# Patient Record
Sex: Male | Born: 1990 | Race: White | Hispanic: No | Marital: Single | Smoking: Current every day smoker
Health system: Southern US, Community
[De-identification: ages and names within clinical notes are randomized; demographics above are authoritative.]

## PROBLEM LIST (undated history)

## (undated) DIAGNOSIS — M549 Dorsalgia, unspecified: Secondary | ICD-10-CM

## (undated) DIAGNOSIS — F419 Anxiety disorder, unspecified: Secondary | ICD-10-CM

## (undated) DIAGNOSIS — F909 Attention-deficit hyperactivity disorder, unspecified type: Secondary | ICD-10-CM

## (undated) HISTORY — PX: OTHER SURGICAL HISTORY: SHX169

## (undated) HISTORY — DX: Anxiety disorder, unspecified: F41.9

## (undated) HISTORY — DX: Attention-deficit hyperactivity disorder, unspecified type: F90.9

## (undated) HISTORY — DX: Dorsalgia, unspecified: M54.9

---
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
## 2011-08-05 ENCOUNTER — Emergency Department: Payer: Self-pay | Admitting: Emergency Medicine

## 2011-12-13 ENCOUNTER — Emergency Department (HOSPITAL_COMMUNITY)
Admission: EM | Admit: 2011-12-13 | Discharge: 2011-12-13 | Disposition: A | Discharge status: Home / Self Care | Payer: Self-pay | Attending: Emergency Medicine | Admitting: Emergency Medicine

## 2011-12-13 ENCOUNTER — Encounter: Payer: Self-pay | Admitting: *Deleted

## 2011-12-13 DIAGNOSIS — M545 Low back pain, unspecified: Secondary | ICD-10-CM | POA: Insufficient documentation

## 2011-12-13 DIAGNOSIS — X500XXA Overexertion from strenuous movement or load, initial encounter: Secondary | ICD-10-CM | POA: Insufficient documentation

## 2011-12-13 DIAGNOSIS — S39012A Strain of muscle, fascia and tendon of lower back, initial encounter: Secondary | ICD-10-CM

## 2011-12-13 DIAGNOSIS — S335XXA Sprain of ligaments of lumbar spine, initial encounter: Secondary | ICD-10-CM | POA: Insufficient documentation

## 2011-12-13 MED ORDER — CYCLOBENZAPRINE HCL 10 MG PO TABS: 10.0000 mg | ORAL_TABLET | Freq: Three times a day (TID) | ORAL | Status: AC | PRN

## 2011-12-13 MED ORDER — HYDROCODONE-ACETAMINOPHEN 5-325 MG PO TABS: ORAL_TABLET | ORAL | Status: AC

## 2011-12-13 NOTE — ED Notes (Signed)
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

## 2011-12-13 NOTE — ED Provider Notes (Signed)
History     CSN: [id number]  Arrival date & time 12/13/11  1541   First MD Initiated Contact with Patient 12/13/11 1629      Chief Complaint  Patient presents with  . Back Pain    (Consider location/radiation/quality/duration/timing/severity/associated sxs/prior treatment) HPI Comments: Patient c/o lower back pain for 2 days.  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  He denies falling,  numbness, tingling or radiation of pain.  Also denies incontinence of urine or bowel function  Patient is a 21 y.o. male presenting with back pain. The history is provided by the patient.  Back Pain  This is a new problem. The current episode started 2 days ago. The problem occurs constantly. The problem has not changed since onset.The pain is associated with lifting heavy objects and twisting (pushing). The pain is present in the lumbar spine. The quality of the pain is described as aching. The pain does not radiate. The pain is moderate. The symptoms are aggravated by bending, twisting and certain positions. The pain is the same all the time. Pertinent negatives include no numbness, no abdominal pain, no abdominal swelling, no bowel incontinence, no perianal numbness, no bladder incontinence, no dysuria, no leg pain, no paresthesias, no paresis, no tingling and no weakness. He has tried nothing for the symptoms. The treatment provided no relief.    History reviewed. No pertinent past medical history.  History reviewed. No pertinent past surgical history.  History reviewed. No pertinent family history.  History  Substance Use Topics  . Smoking status: Former Smoker -- 1.0 packs/day  . Smokeless tobacco: Not on file  . Alcohol Use:       Review of Systems  Gastrointestinal: Negative for abdominal pain and bowel incontinence.  Genitourinary: Negative for bladder incontinence, dysuria, hematuria, decreased urine volume and difficulty urinating.  Musculoskeletal: Positive for back  pain. Negative for joint swelling and gait problem.  Skin: Negative.   Neurological: Negative for tingling, weakness, numbness and paresthesias.  All other systems reviewed and are negative.    Allergies  Review of patient's allergies indicates no known allergies.  Home Medications  No current outpatient prescriptions on file.  BP 124/80  Pulse 50  Temp(Src) 98.4 F (36.9 C) (Oral)  Resp 20  Ht 5\' 4"  (1.626 m)  Wt 120 lb (54.432 kg)  BMI 20.60 kg/m2  SpO2 100%  Physical Exam  Nursing note and vitals reviewed. Constitutional: He is oriented to person, place, and time. He appears well-developed and well-nourished. No distress.  HENT:  Head: Normocephalic and atraumatic.  Mouth/Throat: Oropharynx is clear and moist.  Cardiovascular: Normal rate, regular rhythm and normal heart sounds.   Pulmonary/Chest: Effort normal and breath sounds normal. No respiratory distress.  Abdominal: Soft. He exhibits no distension. There is no tenderness.  Musculoskeletal: Normal range of motion. He exhibits tenderness. He exhibits no edema.       Lumbar back: He exhibits tenderness and pain. He exhibits normal range of motion, no bony tenderness, no swelling, no edema, no laceration and normal pulse.       ttp of the lumbar spinal muscles.    Neurological: He is alert and oriented to person, place, and time. No cranial nerve deficit or sensory deficit. He exhibits normal muscle tone. Coordination normal.  Reflex Scores:      Patellar reflexes are 2+ on the right side and 2+ on the left side.      Achilles reflexes are 2+ on the  right side and 2+ on the left side. Skin: Skin is warm and dry.    ED Course  Procedures (including critical care time)       MDM    ttp of the lumbar paraspinal muscles. No spinal tenderness on exam, no abdominal tenderness.   No focal neuro deficits on exam.  Patient is ambulatory, with slight limp.  Likely muscular strain.  He agrees to close f/u with his PMD  if the pain is not improving  Patient / Family / Caregiver understand and agree with initial ED impression and plan with expectations set for ED visit.         Shaneka Efaw L. Godric Lavell, PA 12/15/11 1320

## 2011-12-13 NOTE — ED Notes (Signed)
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

## 2011-12-16 NOTE — ED Provider Notes (Signed)
Medical screening examination/treatment/procedure(s) were performed by non-physician practitioner and as supervising physician I was immediately available for consultation/collaboration.  Filomeno Cromley K Linker, MD 12/16/11 1316 

## 2012-08-30 ENCOUNTER — Encounter (HOSPITAL_COMMUNITY): Payer: Self-pay | Admitting: *Deleted

## 2012-08-30 ENCOUNTER — Emergency Department (HOSPITAL_COMMUNITY): Payer: Self-pay

## 2012-08-30 ENCOUNTER — Emergency Department (HOSPITAL_COMMUNITY)
Admission: EM | Admit: 2012-08-30 | Discharge: 2012-08-30 | Disposition: A | Discharge status: Home / Self Care | Payer: Self-pay | Attending: Emergency Medicine | Admitting: Emergency Medicine

## 2012-08-30 DIAGNOSIS — F17200 Nicotine dependence, unspecified, uncomplicated: Secondary | ICD-10-CM | POA: Insufficient documentation

## 2012-08-30 DIAGNOSIS — L089 Local infection of the skin and subcutaneous tissue, unspecified: Secondary | ICD-10-CM | POA: Insufficient documentation

## 2012-08-30 DIAGNOSIS — X58XXXA Exposure to other specified factors, initial encounter: Secondary | ICD-10-CM | POA: Insufficient documentation

## 2012-08-30 DIAGNOSIS — Z23 Encounter for immunization: Secondary | ICD-10-CM | POA: Insufficient documentation

## 2012-08-30 MED ORDER — SULFAMETHOXAZOLE-TRIMETHOPRIM 800-160 MG PO TABS: 1.0000 | ORAL_TABLET | Freq: Two times a day (BID) | ORAL | Status: DC

## 2012-08-30 MED ORDER — TETANUS-DIPHTH-ACELL PERTUSSIS 5-2.5-18.5 LF-MCG/0.5 IM SUSP
0.5000 mL | Freq: Once | INTRAMUSCULAR | Status: AC
  Administered 2012-08-30: 0.5 mL via INTRAMUSCULAR
  Filled 2012-08-30: qty 0.5

## 2012-08-30 MED ORDER — SULFAMETHOXAZOLE-TMP DS 800-160 MG PO TABS
1.0000 | ORAL_TABLET | Freq: Once | ORAL | Status: AC
  Administered 2012-08-30: 1 via ORAL
  Filled 2012-08-30: qty 1

## 2012-08-30 NOTE — ED Notes (Signed)
Called to room no answer

## 2012-08-30 NOTE — ED Notes (Signed)
Pt presents with left index finger abscess. Pt states was injured a week prior. Pt's left index finger has noted swelling. Denies fever or swelling of lymph nodes.

## 2012-08-30 NOTE — ED Notes (Signed)
Pt states busted finger a few weeks ago, now finger swollen & hard to bend.

## 2012-09-04 NOTE — ED Provider Notes (Signed)
History     CSN: [id number]  Arrival date & time 08/30/12  1932   First MD Initiated Contact with Patient 08/30/12 2237      Chief Complaint  Patient presents with  . Wound Infection    (Consider location/radiation/quality/duration/timing/severity/associated sxs/prior treatment) HPI Comments: Pt "busted the left index finger while working with a trailer1-2 weeks ago. Now has pain, swelling and soreness with bending.  Patient is a 21 y.o. male presenting with hand pain. The history is provided by the patient.  Hand Pain This is a new problem. The current episode started 1 to 4 weeks ago. The problem occurs daily. The problem has been gradually worsening. Associated symptoms include coughing. Pertinent negatives include no abdominal pain, arthralgias, chest pain, chills, fever, nausea or neck pain. Associated symptoms comments: Pain and swelling of the left index finger.. The symptoms are aggravated by bending. He has tried nothing for the symptoms. The treatment provided no relief.    History reviewed. No pertinent past medical history.  History reviewed. No pertinent past surgical history.  No family history on file.  History  Substance Use Topics  . Smoking status: Current Every Day Smoker -- 1.0 packs/day    Types: Cigarettes  . Smokeless tobacco: Not on file  . Alcohol Use: Yes      Review of Systems  Constitutional: Negative for fever, chills and activity change.       All ROS Neg except as noted in HPI  HENT: Negative for nosebleeds and neck pain.   Eyes: Negative for photophobia and discharge.  Respiratory: Positive for cough. Negative for shortness of breath and wheezing.   Cardiovascular: Negative for chest pain and palpitations.  Gastrointestinal: Negative for nausea, abdominal pain and blood in stool.  Genitourinary: Negative for dysuria, frequency and hematuria.  Musculoskeletal: Negative for back pain and arthralgias.  Skin: Negative.   Neurological:  Negative for dizziness, seizures and speech difficulty.  Psychiatric/Behavioral: Negative for hallucinations and confusion.    Allergies  Review of patient's allergies indicates no known allergies.  Home Medications   Current Outpatient Rx  Name Route Sig Dispense Refill  . CLEAR EYES OP Ophthalmic Apply 1 drop to eye daily as needed. For relief    . SULFAMETHOXAZOLE-TRIMETHOPRIM 800-160 MG PO TABS Oral Take 1 tablet by mouth 2 (two) times daily. 28 tablet 0    BP 108/69  Pulse 67  Temp 97.6 F (36.4 C) (Oral)  Resp 20  Ht 5\' 5"  (1.651 m)  Wt 120 lb (54.432 kg)  BMI 19.97 kg/m2  SpO2 100%  Physical Exam  Nursing note and vitals reviewed. Constitutional: He is oriented to person, place, and time. He appears well-developed and well-nourished.  Non-toxic appearance.  HENT:  Head: Normocephalic.  Right Ear: Tympanic membrane and external ear normal.  Left Ear: Tympanic membrane and external ear normal.  Eyes: EOM and lids are normal. Pupils are equal, round, and reactive to light.  Neck: Normal range of motion. Neck supple. Carotid bruit is not present.  Cardiovascular: Normal rate, regular rhythm, normal heart sounds, intact distal pulses and normal pulses.   Pulmonary/Chest: Breath sounds normal. No respiratory distress.  Abdominal: Soft. Bowel sounds are normal. There is no tenderness. There is no guarding.  Musculoskeletal: Normal range of motion.       The cap refill of the fingers of the left hand is less than 3 sec. Sensory wnl. Swelling and mod redness of the PIP joint area. No red streaking.Tender to palpation.  Lymphadenopathy:       Head (right side): No submandibular adenopathy present.       Head (left side): No submandibular adenopathy present.    He has no cervical adenopathy.  Neurological: He is alert and oriented to person, place, and time. He has normal strength. No cranial nerve deficit or sensory deficit.  Skin: Skin is warm and dry.  Psychiatric: He has  a normal mood and affect. His speech is normal.    ED Course  Procedures (including critical care time)  Labs Reviewed - No data to display No results found. PUlse Ox 100% on room air. WNL by my interpretation.  1. Infected wound       MDM  I have reviewed nursing notes, vital signs, and all appropriate lab and imaging results for this patient. Xray of the left index finger is negative for fracture or dislocation, or fb. Suspect infected wound of the finger. Rx for bactrim given. Tetanus up dated. Discussd need for hand surgery evaluation if not improving.       Kathie Dike, [place] 09/04/12 [phone]

## 2012-09-06 NOTE — ED Provider Notes (Signed)
Medical screening examination/treatment/procedure(s) were performed by non-physician practitioner and as supervising physician I was immediately available for consultation/collaboration. Shatara Stanek, MD, FACEP   Sami Roes L Karesa Maultsby, MD 09/06/12 0029 

## 2013-01-28 ENCOUNTER — Encounter (HOSPITAL_COMMUNITY): Payer: Self-pay | Admitting: *Deleted

## 2013-01-28 ENCOUNTER — Emergency Department (HOSPITAL_COMMUNITY)
Admission: EM | Admit: 2013-01-28 | Discharge: 2013-01-28 | Disposition: A | Discharge status: Home / Self Care | Payer: Self-pay | Attending: Emergency Medicine | Admitting: Emergency Medicine

## 2013-01-28 DIAGNOSIS — R21 Rash and other nonspecific skin eruption: Secondary | ICD-10-CM | POA: Insufficient documentation

## 2013-01-28 DIAGNOSIS — L02419 Cutaneous abscess of limb, unspecified: Secondary | ICD-10-CM | POA: Insufficient documentation

## 2013-01-28 DIAGNOSIS — F17200 Nicotine dependence, unspecified, uncomplicated: Secondary | ICD-10-CM | POA: Insufficient documentation

## 2013-01-28 MED ORDER — SULFAMETHOXAZOLE-TMP DS 800-160 MG PO TABS
1.0000 | ORAL_TABLET | Freq: Once | ORAL | Status: AC
  Administered 2013-01-28: 1 via ORAL
  Filled 2013-01-28: qty 1

## 2013-01-28 MED ORDER — LIDOCAINE HCL (PF) 1 % IJ SOLN
INTRAMUSCULAR | Status: AC
  Filled 2013-01-28: qty 5

## 2013-01-28 MED ORDER — HYDROCODONE-ACETAMINOPHEN 5-325 MG PO TABS
2.0000 | ORAL_TABLET | Freq: Once | ORAL | Status: AC
  Administered 2013-01-28: 2 via ORAL
  Filled 2013-01-28: qty 2

## 2013-01-28 MED ORDER — HYDROCODONE-ACETAMINOPHEN 5-325 MG PO TABS: 2.0000 | ORAL_TABLET | ORAL | Status: DC | PRN

## 2013-01-28 MED ORDER — SULFAMETHOXAZOLE-TRIMETHOPRIM 800-160 MG PO TABS: 1.0000 | ORAL_TABLET | Freq: Two times a day (BID) | ORAL | Status: DC

## 2013-01-28 NOTE — ED Provider Notes (Signed)
Medical screening examination/treatment/procedure(s) were performed by non-physician practitioner and as supervising physician I was immediately available for consultation/collaboration.   Benny Lennert, MD 01/28/13 2240

## 2013-01-28 NOTE — ED Notes (Signed)
Pt presents with abscess to right inner thigh. Pt denies any drainage from area but reports pain.

## 2013-01-28 NOTE — ED Provider Notes (Signed)
History     CSN: [id number]  Arrival date & time 01/28/13  2034   First MD Initiated Contact with Patient 01/28/13 2212      Chief Complaint  Patient presents with  . Recurrent Skin Infections    (Consider location/radiation/quality/duration/timing/severity/associated sxs/prior treatment) Patient is a 22 y.o. male presenting with rash. The history is provided by the patient. No language interpreter was used.  Rash Location:  Leg Leg rash location:  R upper leg Quality: swelling   Severity:  Mild Onset quality:  Gradual Duration:  4 days Timing:  Constant Worsened by:  Nothing tried   History reviewed. No pertinent past medical history.  History reviewed. No pertinent past surgical history.  History reviewed. No pertinent family history.  History  Substance Use Topics  . Smoking status: Current Every Day Smoker -- 1.00 packs/day    Types: Cigarettes  . Smokeless tobacco: Not on file  . Alcohol Use: Yes      Review of Systems  Skin: Positive for wound.  All other systems reviewed and are negative.    Allergies  Review of patient's allergies indicates no known allergies.  Home Medications   Current Outpatient Rx  Name  Route  Sig  Dispense  Refill  . Naphazoline HCl (CLEAR EYES OP)   Ophthalmic   Apply 1 drop to eye daily as needed. For relief           BP 118/57  Pulse 72  Temp(Src) 97.8 F (36.6 C) (Oral)  Resp 20  Ht 5\' 6"  (1.676 m)  Wt 125 lb (56.7 kg)  BMI 20.19 kg/m2  SpO2 100%  Physical Exam  Nursing note and vitals reviewed. Constitutional: He appears well-developed and well-nourished.  Musculoskeletal: He exhibits tenderness.  Neurological: He is alert.  Skin: There is erythema.  1.5 cm area of erythema dark center  Psychiatric: He has a normal mood and affect.    ED Course  INCISION AND DRAINAGE Date/Time: 01/28/2013 10:25 PM Performed by: Elson Areas Authorized by: Elson Areas Consent: Verbal consent  obtained. Consent given by: patient Type: abscess Body area: lower extremity Location details: right leg Anesthesia: local infiltration Local anesthetic: lidocaine 1% without epinephrine Scalpel size: 11 Incision type: single straight Complexity: simple Patient tolerance: Patient tolerated the procedure well with no immediate complications. Comments: Area drained while injecting local,   I made small x shaped incision with small amount of drainage   (including critical care time)  Labs Reviewed - No data to display No results found.   No diagnosis found.    MDM  Bactrim  And hydrocodone         Lonia Skinner [place], [place] 01/28/13 2227

## 2013-01-28 NOTE — ED Notes (Signed)
Pt has an abcess on his right inner thigh.

## 2013-05-16 ENCOUNTER — Emergency Department (HOSPITAL_COMMUNITY)
Admission: EM | Admit: 2013-05-16 | Discharge: 2013-05-16 | Disposition: A | Discharge status: Home / Self Care | Payer: Self-pay | Attending: Emergency Medicine | Admitting: Emergency Medicine

## 2013-05-16 ENCOUNTER — Encounter (HOSPITAL_COMMUNITY): Payer: Self-pay | Admitting: Emergency Medicine

## 2013-05-16 DIAGNOSIS — S39012A Strain of muscle, fascia and tendon of lower back, initial encounter: Secondary | ICD-10-CM

## 2013-05-16 DIAGNOSIS — X500XXA Overexertion from strenuous movement or load, initial encounter: Secondary | ICD-10-CM | POA: Insufficient documentation

## 2013-05-16 DIAGNOSIS — F17200 Nicotine dependence, unspecified, uncomplicated: Secondary | ICD-10-CM | POA: Insufficient documentation

## 2013-05-16 DIAGNOSIS — Y990 Civilian activity done for income or pay: Secondary | ICD-10-CM | POA: Insufficient documentation

## 2013-05-16 DIAGNOSIS — S335XXA Sprain of ligaments of lumbar spine, initial encounter: Secondary | ICD-10-CM | POA: Insufficient documentation

## 2013-05-16 DIAGNOSIS — Y9389 Activity, other specified: Secondary | ICD-10-CM | POA: Insufficient documentation

## 2013-05-16 DIAGNOSIS — Y9289 Other specified places as the place of occurrence of the external cause: Secondary | ICD-10-CM | POA: Insufficient documentation

## 2013-05-16 MED ORDER — HYDROCODONE-ACETAMINOPHEN 5-325 MG PO TABS: ORAL_TABLET | ORAL | Status: DC

## 2013-05-16 MED ORDER — PREDNISONE 10 MG PO TABS: ORAL_TABLET | ORAL | Status: DC

## 2013-05-16 MED ORDER — CYCLOBENZAPRINE HCL 10 MG PO TABS: 10.0000 mg | ORAL_TABLET | Freq: Two times a day (BID) | ORAL | Status: DC | PRN

## 2013-05-16 NOTE — ED Provider Notes (Signed)
History     CSN: [id number]  Arrival date & time 05/16/13  1349   First MD Initiated Contact with Patient 05/16/13 1514      Chief Complaint  Patient presents with  . Back Pain    (Consider location/radiation/quality/duration/timing/severity/associated sxs/prior treatment) HPI Comments: Pain to the middle and lower back that began while using a garden tool.  Pain is reproduced with movement and improves with rest.  He denies incontinence of bladder or bowel, dysuria , shortness of breath or chest pain or abdominal pain  Patient is a 22 y.o. male presenting with back pain. The history is provided by the patient.  Back Pain Location:  Lumbar spine and thoracic spine Quality:  Aching Radiates to:  Does not radiate Pain severity:  Moderate Pain is:  Same all the time Onset quality:  Sudden Duration: one day prior to ED arrival. Timing:  Constant Progression:  Unchanged Chronicity:  New Context: lifting heavy objects and twisting   Relieved by:  Nothing Worsened by:  Bending, twisting, standing and movement Associated symptoms: no abdominal pain, no abdominal swelling, no bladder incontinence, no bowel incontinence, no chest pain, no dysuria, no fever, no headaches, no leg pain, no numbness, no paresthesias, no pelvic pain, no perianal numbness, no tingling and no weakness     History reviewed. No pertinent past medical history.  History reviewed. No pertinent past surgical history.  No family history on file.  History  Substance Use Topics  . Smoking status: Current Every Day Smoker -- 1.00 packs/day    Types: Cigarettes  . Smokeless tobacco: Not on file  . Alcohol Use: No      Review of Systems  Constitutional: Negative for fever.  Respiratory: Negative for shortness of breath.   Cardiovascular: Negative for chest pain.  Gastrointestinal: Negative for vomiting, abdominal pain, constipation and bowel incontinence.  Genitourinary: Negative for bladder incontinence,  dysuria, hematuria, flank pain, decreased urine volume, difficulty urinating and pelvic pain.       No perineal numbness or incontinence of urine or feces  Musculoskeletal: Positive for back pain. Negative for joint swelling.  Skin: Negative for rash.  Neurological: Negative for tingling, weakness, numbness, headaches and paresthesias.  All other systems reviewed and are negative.    Allergies  Review of patient's allergies indicates no known allergies.  Home Medications  No current outpatient prescriptions on file.  BP 113/84  Pulse 67  Temp(Src) 98.1 F (36.7 C) (Oral)  Resp 18  Ht 5\' 6"  (1.676 m)  Wt 125 lb (56.7 kg)  BMI 20.19 kg/m2  SpO2 100%  Physical Exam  Nursing note and vitals reviewed. Constitutional: He is oriented to person, place, and time. He appears well-developed and well-nourished. No distress.  HENT:  Head: Normocephalic and atraumatic.  Neck: Normal range of motion. Neck supple.  Cardiovascular: Normal rate, regular rhythm, normal heart sounds and intact distal pulses.   No murmur heard. Pulmonary/Chest: Effort normal and breath sounds normal. No respiratory distress.  Musculoskeletal: He exhibits tenderness. He exhibits no edema.       Lumbar back: He exhibits tenderness and pain. He exhibits normal range of motion, no swelling, no deformity, no laceration and normal pulse.  ttp of the thoracic and lumbar paraspinal muscles.  No spinal tenderness.  DP pulses are brisk and symmetrical.  Distal sensation intact.  Hip Flexors/Extensors are intact  Neurological: He is alert and oriented to person, place, and time. No cranial nerve deficit or sensory deficit. He exhibits normal muscle  tone. Coordination and gait normal.  Reflex Scores:      Patellar reflexes are 2+ on the right side and 2+ on the left side.      Achilles reflexes are 2+ on the right side and 2+ on the left side. Skin: Skin is warm and dry.    ED Course  Procedures (including critical care  time)  Labs Reviewed - No data to display No results found.      MDM      Patient reviewed on the on narcotics database with no narcotic prescriptions since February 2014.   Patient has tenderness to palpation of the right thoracic and lumbar paraspinal muscles. No spinal tenderness. No focal neuro deficits on exam. No trauma.  Patient ambulates with a steady gait. Doubt emergent neurological or infectious process. Onset of the pain began after activity. He denies trauma.  Vital signs are stable he is nontoxic appearing and appears stable for discharge at this time. I will treat with anti-inflammatory, pain medication and muscle relaxer  Harvey Lingo L. Trisha Mangle, PA-C 05/17/13 [phone]

## 2013-05-16 NOTE — ED Notes (Signed)
Pt c/o right back pain since using maddox to cut down pompous grass yesterday.

## 2013-05-16 NOTE — ED Notes (Signed)
Pt states was swinging a maddox yesterday at work and believes he pulled a muscle. Pt reports lower back pain. Denies incontinence of bowel and bladder function. NAD noted.

## 2013-05-17 NOTE — ED Provider Notes (Signed)
Medical screening examination/treatment/procedure(s) were performed by non-physician practitioner and as supervising physician I was immediately available for consultation/collaboration.   Mung Rinker L Sherree Shankman, MD 05/17/13 0955 

## 2014-04-26 ENCOUNTER — Emergency Department (HOSPITAL_COMMUNITY): Payer: Self-pay

## 2014-04-26 ENCOUNTER — Encounter (HOSPITAL_COMMUNITY): Payer: Self-pay | Admitting: Emergency Medicine

## 2014-04-26 ENCOUNTER — Emergency Department (HOSPITAL_COMMUNITY)
Admission: EM | Admit: 2014-04-26 | Discharge: 2014-04-26 | Disposition: A | Discharge status: Home / Self Care | Payer: Self-pay | Attending: Emergency Medicine | Admitting: Emergency Medicine

## 2014-04-26 DIAGNOSIS — S61259A Open bite of unspecified finger without damage to nail, initial encounter: Secondary | ICD-10-CM

## 2014-04-26 DIAGNOSIS — Y9369 Activity, other involving other sports and athletics played as a team or group: Secondary | ICD-10-CM | POA: Insufficient documentation

## 2014-04-26 DIAGNOSIS — L089 Local infection of the skin and subcutaneous tissue, unspecified: Secondary | ICD-10-CM

## 2014-04-26 DIAGNOSIS — Z792 Long term (current) use of antibiotics: Secondary | ICD-10-CM | POA: Insufficient documentation

## 2014-04-26 DIAGNOSIS — F17200 Nicotine dependence, unspecified, uncomplicated: Secondary | ICD-10-CM | POA: Insufficient documentation

## 2014-04-26 DIAGNOSIS — L02519 Cutaneous abscess of unspecified hand: Secondary | ICD-10-CM | POA: Insufficient documentation

## 2014-04-26 DIAGNOSIS — L03019 Cellulitis of unspecified finger: Secondary | ICD-10-CM

## 2014-04-26 DIAGNOSIS — W540XXA Bitten by dog, initial encounter: Secondary | ICD-10-CM | POA: Insufficient documentation

## 2014-04-26 DIAGNOSIS — S61209A Unspecified open wound of unspecified finger without damage to nail, initial encounter: Secondary | ICD-10-CM | POA: Insufficient documentation

## 2014-04-26 DIAGNOSIS — Y92009 Unspecified place in unspecified non-institutional (private) residence as the place of occurrence of the external cause: Secondary | ICD-10-CM | POA: Insufficient documentation

## 2014-04-26 MED ORDER — LIDOCAINE HCL (PF) 2 % IJ SOLN
INTRAMUSCULAR | Status: AC
  Filled 2014-04-26: qty 10

## 2014-04-26 MED ORDER — AMOXICILLIN-POT CLAVULANATE 875-125 MG PO TABS: 1.0000 | ORAL_TABLET | Freq: Two times a day (BID) | ORAL | Status: DC

## 2014-04-26 MED ORDER — AMOXICILLIN-POT CLAVULANATE 875-125 MG PO TABS
1.0000 | ORAL_TABLET | Freq: Once | ORAL | Status: AC
  Administered 2014-04-26: 1 via ORAL
  Filled 2014-04-26: qty 1

## 2014-04-26 MED ORDER — HYDROCODONE-ACETAMINOPHEN 5-325 MG PO TABS: 1.0000 | ORAL_TABLET | ORAL | Status: DC | PRN

## 2014-04-26 NOTE — ED Notes (Signed)
Officer here to take pt's report of dog bite

## 2014-04-26 NOTE — ED Notes (Signed)
Officer states pt does not want to report dog bite.  States now his finger is infected from working in [employer]

## 2014-04-26 NOTE — Discharge Instructions (Signed)
Animal Bite °An animal bite can result in a scratch on the skin, deep open cut, puncture of the skin, crush injury, or tearing away of the skin or a body part. Dogs are responsible for most animal bites. Children are bitten more often than adults. An animal bite can range from very mild to more serious. A small bite from your house pet is no cause for alarm. However, some animal bites can become infected or injure a bone or other tissue. You must seek medical care if: °· The skin is broken and bleeding does not slow down or stop after 15 minutes. °· The puncture is deep and difficult to clean (such as a cat bite). °· Pain, warmth, redness, or pus develops around the wound. °· The bite is from a stray animal or rodent. There may be a risk of rabies infection. °· The bite is from a snake, raccoon, skunk, fox, coyote, or bat. There may be a risk of rabies infection. °· The person bitten has a chronic illness such as diabetes, liver disease, or cancer, or the person takes medicine that lowers the immune system. °· There is concern about the location and severity of the bite. °It is important to clean and protect an animal bite wound right away to prevent infection. Follow these steps: °· Clean the wound with plenty of water and soap. °· Apply an antibiotic cream. °· Apply gentle pressure over the wound with a clean towel or gauze to slow or stop bleeding. °· Elevate the affected area above the heart to help stop any bleeding. °· Seek medical care. Getting medical care within 8 hours of the animal bite leads to the best possible outcome. °DIAGNOSIS  °Your caregiver will most likely: °· Take a detailed history of the animal and the bite injury. °· Perform a wound exam. °· Take your medical history. °Blood tests or X-rays may be performed. Sometimes, infected bite wounds are cultured and sent to a lab to identify the infectious bacteria.  °TREATMENT  °Medical treatment will depend on the location and type of animal bite as  well as the patient's medical history. Treatment may include: °· Wound care, such as cleaning and flushing the wound with saline solution, bandaging, and elevating the affected area. °· Antibiotics. °· Tetanus immunization. °· Rabies immunization. °· Leaving the wound open to heal. This is often done with animal bites, due to the high risk of infection. However, in certain cases, wound closure with stitches, wound adhesive, skin adhesive strips, or staples may be used. ° Infected bites that are left untreated may require intravenous (IV) antibiotics and surgical treatment in the hospital. °HOME CARE INSTRUCTIONS °· Follow your caregiver's instructions for wound care. °· Take all medicines as directed. °· If your caregiver prescribes antibiotics, take them as directed. Finish them even if you start to feel better. °· Follow up with your caregiver for further exams or immunizations as directed. °You may need a tetanus shot if: °· You cannot remember when you had your last tetanus shot. °· You have never had a tetanus shot. °· The injury broke your skin. °If you get a tetanus shot, your arm may swell, get red, and feel warm to the touch. This is common and not a problem. If you need a tetanus shot and you choose not to have one, there is a rare Jue of getting tetanus. Sickness from tetanus can be serious. °SEEK MEDICAL CARE IF: °· You notice warmth, redness, soreness, swelling, pus discharge, or a bad   smell coming from the wound.  You have a red line on the skin coming from the wound.  You have a fever, chills, or a general ill feeling.  You have nausea or vomiting.  You have continued or worsening pain.  You have trouble moving the injured part.  You have other questions or concerns. MAKE SURE YOU:  Understand these instructions.  Will watch your condition.  Will get help right away if you are not doing well or get worse. Document Released: 08/10/2011 Document Revised: 02/14/2012 Document  Reviewed: 08/10/2011 [facility] Patient Information 2014 [place], [place].     Soak your finger in warm soapy water three times daily and gently massage to help with any residual infection/ bacteria.  You need to take the entire course of antibiotics,  Taking your next dose this evening.  Keep your finger clean and dry when not soaking.  As discussed you safely have 5 days from now to start the rabies series if animal control is not able to confirm this dog is up to date.  Return here in 2 days for a recheck of your wound.

## 2014-04-26 NOTE — ED Notes (Signed)
Pt states he went into his neighbors yard to get his foot ball and was bit by a boxer mix. Pt states the dogs shots are up to date. States he does not know the address where the dog is. States he did not report bite to animal control.

## 2014-04-26 NOTE — ED Notes (Signed)
Pt presents with swelling and puncture wound to left index finger. Pt states he was bit by a dog approximately 1 week ago.

## 2014-04-27 NOTE — ED Provider Notes (Signed)
CSN: [id number]     Arrival date & time 04/26/14  2103 History   First MD Initiated Contact with Patient 04/26/14 [phone]     Chief Complaint  Patient presents with  . Finger Injury     (Consider location/radiation/quality/duration/timing/severity/associated sxs/prior Treatment) HPI Comments: Ethan Little is a 23 y.o. Male presenting with an infected dog bite on his left index finger. The injury occurred one week ago when he was playing ball in his backyard.  His ball landed in the neighbors yard who's dog is contained by an underground fence.  The neighbor stated it would be ok to retrieve the ball, the dog was not happy about this and bit his finger.  He was assured the dog is up to date with his vaccines.  Pt states the dog is well cared for and has tags on his collar which he assumes are rabies tags.  He has not contacted animal control about this incident and does not wish to. His finger has slowly become more swollen and has now developed a small pustule at the volar pip joint. He denies numbness distal to the injury site.  He can flex and extend his finger, but is limited by swelling.  He has used warm soaks without relief of pain or swelling.     The history is provided by the patient.    History reviewed. No pertinent past medical history. History reviewed. No pertinent past surgical history. History reviewed. No pertinent family history. History  Substance Use Topics  . Smoking status: Current Every Day Smoker -- 1.00 packs/day    Types: Cigarettes  . Smokeless tobacco: Not on file  . Alcohol Use: No    Review of Systems  Constitutional: Negative for fever and chills.  Respiratory: Negative for shortness of breath and wheezing.   Skin: Positive for wound.  Neurological: Negative for numbness.      Allergies  Review of patient's allergies indicates no known allergies.  Home Medications   Prior to Admission medications   Medication Sig Start Date End Date Taking?  Authorizing Provider  amoxicillin-clavulanate (AUGMENTIN) 875-125 MG per tablet Take 1 tablet by mouth every 12 (twelve) hours. 04/26/14   Burgess Amor, PA-C  HYDROcodone-acetaminophen (NORCO/VICODIN) 5-325 MG per tablet Take 1 tablet by mouth every 4 (four) hours as needed for moderate pain. 04/26/14   Burgess Amor, PA-C   BP 115/84  Pulse 54  Temp(Src) 98 F (36.7 C) (Oral)  Resp 18  SpO2 100% Physical Exam  Constitutional: He is oriented to person, place, and time. He appears well-developed and well-nourished.  HENT:  Head: Normocephalic.  Cardiovascular: Normal rate.   Pulmonary/Chest: Effort normal.  Musculoskeletal: He exhibits edema and tenderness.       Left hand: He exhibits tenderness and swelling. He exhibits normal range of motion, normal capillary refill and no deformity. Normal sensation noted.  Small pustule left volap pcp with surrounding edema, mild erythema without red streaking. No hand involvement.  Pustule is intact.  Less than 3 sec distal cap refill.    Neurological: He is alert and oriented to person, place, and time. No sensory deficit.  Skin: Laceration noted.    ED Course  Procedures (including critical care time)   INCISION AND DRAINAGE Performed by: Burgess Amor Consent: Verbal consent obtained. Risks and benefits: risks, benefits and alternatives were discussed Type: abscess  Body area: left index finger  Anesthesia: digital block Incision was made with a scalpel.  Local anesthetic: lidocaine 2% without epinephrine  Anesthetic total: 0.5 ml  Complexity: simple Blunt dissection to break up loculations  Drainage: purulent  Drainage amount: small  Packing material: no packing Patient tolerance: Patient tolerated the procedure well with no immediate complications.    Labs Review Labs Reviewed - No data to display  Imaging Review Dg Finger Index Left  04/26/2014   CLINICAL DATA:  Dog bite, puncture wound at PIP joint LEFT index finger  EXAM:  LEFT INDEX FINGER 2+V  COMPARISON:  08/30/2012  FINDINGS: Soft tissue swelling LEFT index finger at proximal phalanx and PIP joint.  Osseous mineralization normal.  Joint spaces preserved.  No fracture, dislocation, or bone destruction.  No radiopaque foreign body or soft tissue gas.  IMPRESSION: No acute osseous abnormalities.   Electronically Signed   By: Ulyses SouthwardMark  Boles M.D.   On: 04/26/2014 10:31     EKG Interpretation None      MDM   Final diagnoses:  Infected bite wound of finger    Animal control was notified of this bite,  However patient was uncooperative in filing report.  He states he is not worried about rabies or the health of the dog as it is well cared for and believes the dog is utd on his vaccines.  Discussed risks and serious nature of rabies.  Pt defers filing report and tx at this time.  Advised he has 5 days to safely start the rabies series if he changes his mind or finds the dog is potentially unsafe.  Pt understands.  He was placed on augmentin, hydrocodone.  He is to start warm soapy water soaks bid.  Recheck here in 2 days for any worsened sx or if not improving by this time.  Doubt septic joint. He can flex and extend without too much pain.  No dorsal pip joint edema or erythema.    Burgess AmorJulie Yuka Lallier, PA-C 04/27/14 2146

## 2014-04-28 ENCOUNTER — Emergency Department (HOSPITAL_COMMUNITY)
Admission: EM | Admit: 2014-04-28 | Discharge: 2014-04-28 | Disposition: A | Discharge status: Home / Self Care | Payer: Self-pay | Attending: Emergency Medicine | Admitting: Emergency Medicine

## 2014-04-28 ENCOUNTER — Encounter (HOSPITAL_COMMUNITY): Payer: Self-pay | Admitting: Emergency Medicine

## 2014-04-28 DIAGNOSIS — T368X5A Adverse effect of other systemic antibiotics, initial encounter: Secondary | ICD-10-CM | POA: Insufficient documentation

## 2014-04-28 DIAGNOSIS — Z79899 Other long term (current) drug therapy: Secondary | ICD-10-CM | POA: Insufficient documentation

## 2014-04-28 DIAGNOSIS — T3695XA Adverse effect of unspecified systemic antibiotic, initial encounter: Secondary | ICD-10-CM

## 2014-04-28 DIAGNOSIS — K521 Toxic gastroenteritis and colitis: Secondary | ICD-10-CM

## 2014-04-28 DIAGNOSIS — Z87828 Personal history of other (healed) physical injury and trauma: Secondary | ICD-10-CM | POA: Insufficient documentation

## 2014-04-28 DIAGNOSIS — Z5189 Encounter for other specified aftercare: Secondary | ICD-10-CM

## 2014-04-28 DIAGNOSIS — Z4800 Encounter for change or removal of nonsurgical wound dressing: Secondary | ICD-10-CM | POA: Insufficient documentation

## 2014-04-28 DIAGNOSIS — R197 Diarrhea, unspecified: Secondary | ICD-10-CM | POA: Insufficient documentation

## 2014-04-28 DIAGNOSIS — F17200 Nicotine dependence, unspecified, uncomplicated: Secondary | ICD-10-CM | POA: Insufficient documentation

## 2014-04-28 NOTE — ED Provider Notes (Signed)
CSN: [id number]     Arrival date & time 04/28/14  1740 History   First MD Initiated Contact with Patient 04/28/14 1758     Chief Complaint  Patient presents with  . Wound Check     (Consider location/radiation/quality/duration/timing/severity/associated sxs/prior Treatment) HPI Patient was seen in the ED 2 days ago after he was bitten by a dog the week before on his left index finger. He states since he was seen the swelling is better, the redness is better. He feels a knot where the wound was incised, and he states to flex his finger is uncomfortable because it feels tight. He also reports he is getting diarrhea from antibiotic. He states he is soaking his finger in warm Epsom salts.  PCP none  History reviewed. No pertinent past medical history. History reviewed. No pertinent past surgical history. No family history on file. History  Substance Use Topics  . Smoking status: Current Every Day Smoker -- 1.00 packs/day    Types: Cigarettes  . Smokeless tobacco: Not on file  . Alcohol Use: No  employed in landscaping  Review of Systems  All other systems reviewed and are negative.     Allergies  Review of patient's allergies indicates no known allergies.  Home Medications   Prior to Admission medications   Medication Sig Start Date End Date Taking? Authorizing Provider  amoxicillin-clavulanate (AUGMENTIN) 875-125 MG per tablet Take 1 tablet by mouth every 12 (twelve) hours. 04/26/14  Yes Raynelle Fanning Idol, PA-C   BP 117/74  Pulse 58  Temp(Src) 97.9 F (36.6 C) (Oral)  Resp 20  SpO2 99%  Vital signs normal except bradycardia  Physical Exam  Nursing note and vitals reviewed. Constitutional: He is oriented to person, place, and time. He appears well-developed and well-nourished.  Non-toxic appearance. He does not appear ill. No distress.  HENT:  Head: Normocephalic and atraumatic.  Right Ear: External ear normal.  Left Ear: External ear normal.  Nose: Nose normal. No  mucosal edema or rhinorrhea.  Mouth/Throat: Mucous membranes are normal. No dental abscesses or uvula swelling.  Eyes: Conjunctivae and EOM are normal. Pupils are equal, round, and reactive to light.  Neck: Normal range of motion and full passive range of motion without pain. Neck supple.  Pulmonary/Chest: Effort normal and breath sounds normal. No respiratory distress. He has no rhonchi. He exhibits no crepitus.  Abdominal: Normal appearance.  Musculoskeletal: Normal range of motion. He exhibits tenderness. He exhibits no edema.  Moves all extremities well. Patient has pictures of his finger or to his last ED visit. The degree of swelling of his finger is much improved. The redness is gone. The purulent that was at the flexural surface of the PIP joint is gone. There is a small scabbing in the skin in the flexural crease of the PIP joint of the left index finger with some mild firmness underneath the skin but does not feel fluctuant like a abscess. He has some limited flexion. However extension is intact.  Neurological: He is alert and oriented to person, place, and time. He has normal strength. No cranial nerve deficit.  Skin: Skin is warm, dry and intact. No rash noted. No erythema. No pallor.  Psychiatric: He has a normal mood and affect. His speech is normal and behavior is normal. His mood appears not anxious.      ED Course  Procedures (including critical care time)  We discussed what to do for the diarrhea including avoiding milk products and heating a yogurt and  taking Imodium over-the-counter. Patient is very small and thin. He was advised he could drop his Augmentin down to 875 mg once a day. He again did not express interest in doing the rabies vaccines. He is advised to continue the treatment. He is to return if the finger gets more painful, red, or swollen or if he sees pus under the skin. Patient states because of the diarrhea he needs a work excuse for tomorrow.   Labs  Review Labs Reviewed - No data to display  Imaging Review No results found.   EKG Interpretation None      MDM   Final diagnoses:  Encounter for wound re-check  Antibiotic-associated diarrhea    Plan discharge  Devoria AlbeIva Tymeir Weathington, MD, Franz DellFACEP     Pradeep Beaubrun L Iyla Balzarini, MD 04/28/14 [phone]

## 2014-04-28 NOTE — ED Notes (Signed)
Pt returns to er for recheck of left index finger, was seen in er 04/26/2014 for dog bite, states that the area is better, is not having as much pressure as before,

## 2014-04-28 NOTE — Discharge Instructions (Signed)
Decrease the Augmentin to once a day. Eat yogurt daily and take imodium for diarrhea. Avoid milk as it will make the diarrhea worse. Continue soaking your finger in warm epsom salts. Recheck if your fingers gets red, more swollen, more painful or you see pus again. If you are unable to start flexing the finger in the next week you need to be rechecked again.

## 2014-04-29 NOTE — ED Provider Notes (Signed)
Medical screening examination/treatment/procedure(s) were conducted as a shared visit with non-physician practitioner(s) and myself.  I personally evaluated the patient during the encounter.   EKG Interpretation None     Patient with dog bite to finger. Doubt joint infections that this time. Patient would not give information about the dog and is leaving AMA.  Ethan Little. Rubin Payor, MD 04/29/14 1453

## 2014-07-31 ENCOUNTER — Encounter (HOSPITAL_COMMUNITY): Payer: Self-pay | Admitting: Emergency Medicine

## 2014-07-31 ENCOUNTER — Emergency Department (HOSPITAL_COMMUNITY)
Admission: EM | Admit: 2014-07-31 | Discharge: 2014-07-31 | Disposition: A | Discharge status: Home / Self Care | Payer: Self-pay | Attending: Emergency Medicine | Admitting: Emergency Medicine

## 2014-07-31 DIAGNOSIS — F17200 Nicotine dependence, unspecified, uncomplicated: Secondary | ICD-10-CM | POA: Insufficient documentation

## 2014-07-31 DIAGNOSIS — K047 Periapical abscess without sinus: Secondary | ICD-10-CM | POA: Insufficient documentation

## 2014-07-31 DIAGNOSIS — K089 Disorder of teeth and supporting structures, unspecified: Secondary | ICD-10-CM | POA: Insufficient documentation

## 2014-07-31 MED ORDER — AMOXICILLIN 250 MG PO CAPS
500.0000 mg | ORAL_CAPSULE | Freq: Once | ORAL | Status: AC
  Administered 2014-07-31: 500 mg via ORAL
  Filled 2014-07-31: qty 2

## 2014-07-31 MED ORDER — HYDROCODONE-ACETAMINOPHEN 5-325 MG PO TABS
1.0000 | ORAL_TABLET | Freq: Once | ORAL | Status: AC
  Administered 2014-07-31: 1 via ORAL
  Filled 2014-07-31: qty 1

## 2014-07-31 MED ORDER — HYDROCODONE-ACETAMINOPHEN 5-325 MG PO TABS: 1.0000 | ORAL_TABLET | ORAL | Status: DC | PRN

## 2014-07-31 MED ORDER — AMOXICILLIN 500 MG PO CAPS: 500.0000 mg | ORAL_CAPSULE | Freq: Three times a day (TID) | ORAL | Status: DC

## 2014-07-31 NOTE — ED Provider Notes (Signed)
Medical screening examination/treatment/procedure(s) were performed by non-physician practitioner and as supervising physician I was immediately available for consultation/collaboration.    Linwood Dibbles, MD 07/31/14 [phone]

## 2014-07-31 NOTE — ED Provider Notes (Signed)
CSN: [id number]     Arrival date & time 07/31/14  4098 History   First MD Initiated Contact with Patient 07/31/14 0900     Chief Complaint  Patient presents with  . Dental Pain     (Consider location/radiation/quality/duration/timing/severity/associated sxs/prior Treatment) HPI  Ethan Little is a 22 y.o. male 2 day history of dental pain with gingival and left lower cheek swelling starting last night.   The patient has a history of injury and/or decay in the tooth involved which has recently started to cause increased  pain.  There has been no fevers, chills, nausea or vomiting, also no complaint of difficulty swallowing, although chewing makes pain worse.  The patient has tried no medications prior to arrival.     History reviewed. No pertinent past medical history. History reviewed. No pertinent past surgical history. No family history on file. History  Substance Use Topics  . Smoking status: Current Every Day Smoker -- 0.50 packs/day    Types: Cigarettes  . Smokeless tobacco: Not on file  . Alcohol Use: Yes     Comment: occassionally    Review of Systems  Constitutional: Negative for fever.  HENT: Positive for dental problem. Negative for facial swelling and sore throat.   Respiratory: Negative for shortness of breath.   Musculoskeletal: Negative for neck pain and neck stiffness.      Allergies  Review of patient's allergies indicates no known allergies.  Home Medications   Prior to Admission medications   Medication Sig Start Date End Date Taking? Authorizing Provider  amoxicillin (AMOXIL) 500 MG capsule Take 1 capsule (500 mg total) by mouth 3 (three) times daily. 07/31/14   Burgess Amor, PA-C  amoxicillin-clavulanate (AUGMENTIN) 875-125 MG per tablet Take 1 tablet by mouth every 12 (twelve) hours. 04/26/14   Burgess Amor, PA-C  HYDROcodone-acetaminophen (NORCO/VICODIN) 5-325 MG per tablet Take 1 tablet by mouth every 4 (four) hours as needed. 07/31/14   Burgess Amor,  PA-C   BP 119/82  Pulse 60  Temp(Src) 98.2 F (36.8 C) (Oral)  Resp 20  Ht  (1.702 m)  Wt 125 lb (56.7 kg)  BMI 19.57 kg/m2  SpO2 100% Physical Exam  Constitutional: He is oriented to person, place, and time. He appears well-developed and well-nourished. No distress.  HENT:  Head: Normocephalic and atraumatic.  Right Ear: Tympanic membrane and external ear normal.  Left Ear: Tympanic membrane and external ear normal.  Mouth/Throat: Oropharynx is clear and moist and mucous membranes are normal. No oral lesions. Dental abscesses present.    Eyes: Conjunctivae are normal.  Neck: Normal range of motion. Neck supple.  Cardiovascular: Normal rate and normal heart sounds.   Pulmonary/Chest: Effort normal.  Abdominal: He exhibits no distension.  Musculoskeletal: Normal range of motion.  Lymphadenopathy:    He has no cervical adenopathy.  Neurological: He is alert and oriented to person, place, and time.  Skin: Skin is warm and dry. No erythema.  Psychiatric: He has a normal mood and affect.    ED Course  Procedures (including critical care time) Labs Review Labs Reviewed - No data to display  Imaging Review No results found.   EKG Interpretation None      MDM   Final diagnoses:  Dental abscess   Pt prescribed hydrocodone, amoxil.  Dental resources given including the free dental clinic being offered this weekend in [place].     Burgess Amor, PA-C 07/31/14 [phone]

## 2014-07-31 NOTE — ED Notes (Signed)
PT c/o dental pain with facial swelling x1 day. PT c/o left upper dental pain.

## 2014-07-31 NOTE — Discharge Instructions (Signed)
Dental Abscess °A dental abscess is a collection of infected fluid (pus) from a bacterial infection in the inner part of the tooth (pulp). It usually occurs at the end of the tooth's root.  °CAUSES  °· Severe tooth decay. °· Trauma to the tooth that allows bacteria to enter into the pulp, such as a broken or chipped tooth. °SYMPTOMS  °· Severe pain in and around the infected tooth. °· Swelling and redness around the abscessed tooth or in the mouth or face. °· Tenderness. °· Pus drainage. °· Bad breath. °· Bitter taste in the mouth. °· Difficulty swallowing. °· Difficulty opening the mouth. °· Nausea. °· Vomiting. °· Chills. °· Swollen neck glands. °DIAGNOSIS  °· A medical and dental history will be taken. °· An examination will be performed by tapping on the abscessed tooth. °· X-rays may be taken of the tooth to identify the abscess. °TREATMENT °The goal of treatment is to eliminate the infection. You may be prescribed antibiotic medicine to stop the infection from spreading. A root canal may be performed to save the tooth. If the tooth cannot be saved, it may be pulled (extracted) and the abscess may be drained.  °HOME CARE INSTRUCTIONS °· Only take over-the-counter or prescription medicines for pain, fever, or discomfort as directed by your caregiver. °· Rinse your mouth (gargle) often with salt water (¼ tsp salt in 8 oz [250 ml] of warm water) to relieve pain or swelling. °· Do not drive after taking pain medicine (narcotics). °· Do not apply heat to the outside of your face. °· Return to your dentist for further treatment as directed. °SEEK MEDICAL CARE IF: °· Your pain is not helped by medicine. °· Your pain is getting worse instead of better. °SEEK IMMEDIATE MEDICAL CARE IF: °· You have a fever or persistent symptoms for more than 2-3 days. °· You have a fever and your symptoms suddenly get worse. °· You have chills or a very bad headache. °· You have problems breathing or swallowing. °· You have trouble  opening your mouth. °· You have swelling in the neck or around the eye. °Document Released: 11/22/2005 Document Revised: 08/16/2012 Document Reviewed: 03/02/2011 °[employer]® Patient Information ©2015 [employer]. This information is not intended to replace advice given to you by your health care provider. Make sure you discuss any questions you have with your health care provider. ° ° °Complete your entire course of antibiotics as prescribed.  You  may use the hydrocodone for pain relief but do not drive within 4 hours of taking as this will make you drowsy.  Avoid applying heat or ice to this abscess area which can worsen your symptoms.  You may use warm salt water swish and spit treatment or half peroxide and water swish and spit after meals to keep this area clean as discussed.  Call the dentist listed above for further management of your symptoms. ° ° °

## 2014-09-21 ENCOUNTER — Emergency Department (HOSPITAL_COMMUNITY)
Admission: EM | Admit: 2014-09-21 | Discharge: 2014-09-21 | Disposition: A | Discharge status: Home / Self Care | Payer: Self-pay | Attending: Emergency Medicine | Admitting: Emergency Medicine

## 2014-09-21 ENCOUNTER — Encounter (HOSPITAL_COMMUNITY): Payer: Self-pay | Admitting: Emergency Medicine

## 2014-09-21 DIAGNOSIS — H6002 Abscess of left external ear: Secondary | ICD-10-CM | POA: Insufficient documentation

## 2014-09-21 DIAGNOSIS — Z79899 Other long term (current) drug therapy: Secondary | ICD-10-CM | POA: Insufficient documentation

## 2014-09-21 DIAGNOSIS — L0291 Cutaneous abscess, unspecified: Secondary | ICD-10-CM

## 2014-09-21 DIAGNOSIS — Z720 Tobacco use: Secondary | ICD-10-CM | POA: Insufficient documentation

## 2014-09-21 DIAGNOSIS — Z792 Long term (current) use of antibiotics: Secondary | ICD-10-CM | POA: Insufficient documentation

## 2014-09-21 MED ORDER — CEPHALEXIN 500 MG PO CAPS
500.0000 mg | ORAL_CAPSULE | Freq: Once | ORAL | Status: AC
  Administered 2014-09-21: 500 mg via ORAL
  Filled 2014-09-21: qty 1

## 2014-09-21 MED ORDER — SULFAMETHOXAZOLE-TRIMETHOPRIM 800-160 MG PO TABS: 1.0000 | ORAL_TABLET | Freq: Two times a day (BID) | ORAL | Status: AC

## 2014-09-21 MED ORDER — ACETAMINOPHEN-CODEINE #3 300-30 MG PO TABS: 1.0000 | ORAL_TABLET | Freq: Four times a day (QID) | ORAL | Status: DC | PRN

## 2014-09-21 MED ORDER — SULFAMETHOXAZOLE-TMP DS 800-160 MG PO TABS
1.0000 | ORAL_TABLET | Freq: Once | ORAL | Status: AC
  Administered 2014-09-21: 1 via ORAL
  Filled 2014-09-21: qty 1

## 2014-09-21 MED ORDER — IBUPROFEN 800 MG PO TABS: 800.0000 mg | ORAL_TABLET | Freq: Three times a day (TID) | ORAL | Status: DC

## 2014-09-21 MED ORDER — KETOROLAC TROMETHAMINE 10 MG PO TABS
10.0000 mg | ORAL_TABLET | Freq: Once | ORAL | Status: AC
  Administered 2014-09-21: 10 mg via ORAL
  Filled 2014-09-21: qty 1

## 2014-09-21 NOTE — Discharge Instructions (Signed)
Please apply warm compresses to the area behind the left ear. Use septra and ibuprofen daily with food. Use tylenol codeine for pain if needed. Abscess An abscess (boil or furuncle) is an infected area on or under the skin. This area is filled with yellowish-white fluid (pus) and other material (debris). HOME CARE   Only take medicines as told by your doctor.  If you were given antibiotic medicine, take it as directed. Finish the medicine even if you start to feel better.  If gauze is used, follow your doctor's directions for changing the gauze.  To avoid spreading the infection:  Keep your abscess covered with a bandage.  Wash your hands well.  Do not share personal care items, towels, or whirlpools with others.  Avoid skin contact with others.  Keep your skin and clothes clean around the abscess.  Keep all doctor visits as told. GET HELP RIGHT AWAY IF:   You have more pain, puffiness (swelling), or redness in the wound site.  You have more fluid or blood coming from the wound site.  You have muscle aches, chills, or you feel sick.  You have a fever. MAKE SURE YOU:   Understand these instructions.  Will watch your condition.  Will get help right away if you are not doing well or get worse. Document Released: 05/10/2008 Document Revised: 05/23/2012 Document Reviewed: 02/04/2012 [facility] Patient Information 2015 [place], [place]. This information is not intended to replace advice given to you by your health care provider. Make sure you discuss any questions you have with your health care provider.

## 2014-09-21 NOTE — ED Notes (Signed)
PT noticed red raised area to scalp 6 days ago and the pain and swelling has progressively worsened. PT has black area noted to abscess.

## 2014-09-21 NOTE — ED Provider Notes (Signed)
  Medical screening examination/treatment/procedure(s) were performed by non-physician practitioner and as supervising physician I was immediately available for consultation/collaboration.   EKG Interpretation None         Gerhard Munchobert Abraham Entwistle, MD 09/21/14 1425

## 2014-09-21 NOTE — ED Provider Notes (Signed)
CSN: [id number]     Arrival date & time 09/21/14  1216 History   First MD Initiated Contact with Patient 09/21/14 1223     Chief Complaint  Patient presents with  . Abscess     (Consider location/radiation/quality/duration/timing/severity/associated sxs/prior Treatment) Patient is a 23 y.o. male presenting with abscess. The history is provided by the patient.  Abscess Location:  Head/neck Head/neck abscess location:  L ear (behind the left ear) Abscess quality: painful, redness and warmth   Red streaking: no   Duration:  6 days Progression:  Worsening Pain details:    Quality:  Aching   Severity:  Severe   Duration:  6 days   Timing:  Intermittent   Progression:  Worsening Chronicity:  New Context: not diabetes and not insect bite/sting   Relieved by:  Nothing Worsened by:  Warm compresses Ineffective treatments:  Draining/squeezing   History reviewed. No pertinent past medical history. History reviewed. No pertinent past surgical history. No family history on file. History  Substance Use Topics  . Smoking status: Current Every Day Smoker -- 0.50 packs/day    Types: Cigarettes  . Smokeless tobacco: Not on file  . Alcohol Use: Yes     Comment: occassionally    Review of Systems  Constitutional: Negative for activity change.       All ROS Neg except as noted in HPI  Eyes: Negative for photophobia and discharge.  Respiratory: Negative for cough, shortness of breath and wheezing.   Cardiovascular: Negative for chest pain and palpitations.  Gastrointestinal: Negative for abdominal pain and blood in stool.  Genitourinary: Negative for dysuria, frequency and hematuria.  Musculoskeletal: Negative for arthralgias, back pain and neck pain.  Skin: Negative.   Neurological: Negative for dizziness, seizures and speech difficulty.  Psychiatric/Behavioral: Negative for hallucinations and confusion.      Allergies  Review of patient's allergies indicates no known  allergies.  Home Medications   Prior to Admission medications   Medication Sig Start Date End Date Taking? Authorizing Provider  acetaminophen-codeine (TYLENOL #3) 300-30 MG per tablet Take 1-2 tablets by mouth every 6 (six) hours as needed. 09/21/14   Kathie DikeHobson M Sani Madariaga, PA-C  amoxicillin (AMOXIL) 500 MG capsule Take 1 capsule (500 mg total) by mouth 3 (three) times daily. 07/31/14   Burgess AmorJulie Idol, PA-C  amoxicillin-clavulanate (AUGMENTIN) 875-125 MG per tablet Take 1 tablet by mouth every 12 (twelve) hours. 04/26/14   Burgess AmorJulie Idol, PA-C  HYDROcodone-acetaminophen (NORCO/VICODIN) 5-325 MG per tablet Take 1 tablet by mouth every 4 (four) hours as needed. 07/31/14   Burgess AmorJulie Idol, PA-C  ibuprofen (ADVIL,MOTRIN) 800 MG tablet Take 1 tablet (800 mg total) by mouth 3 (three) times daily. 09/21/14   Kathie DikeHobson M Capone Schwinn, PA-C  sulfamethoxazole-trimethoprim (BACTRIM DS,SEPTRA DS) 800-160 MG per tablet Take 1 tablet by mouth 2 (two) times daily. 09/21/14 09/28/14  Kathie DikeHobson M Cristine Daw, PA-C   BP 111/70  Pulse 83  Temp(Src) 97.7 F (36.5 C) (Oral)  Resp 18  Ht 5\' 6"  (1.676 m)  Wt 125 lb (56.7 kg)  BMI 20.19 kg/m2  SpO2 99% Physical Exam  Nursing note and vitals reviewed. Constitutional: He is oriented to person, place, and time. He appears well-developed and well-nourished.  Non-toxic appearance.  HENT:  Head: Normocephalic.  Right Ear: Tympanic membrane and external ear normal.  Left Ear: Tympanic membrane and external ear normal.  Raised red tender area behind the left ear. Scab in the center of the raised red area. No red steaks. No drainage.  Eyes: EOM and lids are normal. Pupils are equal, round, and reactive to light.  Neck: Normal range of motion. Neck supple. Carotid bruit is not present.  Cardiovascular: Normal rate, regular rhythm, normal heart sounds, intact distal pulses and normal pulses.   Pulmonary/Chest: Breath sounds normal. No respiratory distress.  Abdominal: Soft. Bowel sounds are normal. There  is no tenderness. There is no guarding.  Musculoskeletal: Normal range of motion.  Lymphadenopathy:       Head (right side): No submandibular adenopathy present.       Head (left side): No submandibular adenopathy present.    He has no cervical adenopathy.  Neurological: He is alert and oriented to person, place, and time. He has normal strength. No cranial nerve deficit or sensory deficit.  Skin: Skin is warm and dry.  Psychiatric: He has a normal mood and affect. His speech is normal.    ED Course  Procedures (including critical care time) Labs Review Labs Reviewed - No data to display  Imaging Review No results found.   EKG Interpretation None      MDM Vital signs stable. No red streaks at the abscess site. The abscess area is not a candidate for I and D at this time. Pt to use bactrim, ibuprofen, and tylenol codeine. Pt to return if not improving.   Final diagnoses:  Abscess    **I have reviewed nursing notes, vital signs, and all appropriate lab and imaging results for this patient.Kathie Dike*    Barbarajean Kinzler M Rucker Pridgeon, PA-C 09/21/14 1258

## 2014-11-06 ENCOUNTER — Emergency Department (HOSPITAL_COMMUNITY)
Admission: EM | Admit: 2014-11-06 | Discharge: 2014-11-06 | Disposition: A | Discharge status: Home / Self Care | Payer: Self-pay | Attending: Emergency Medicine | Admitting: Emergency Medicine

## 2014-11-06 ENCOUNTER — Encounter (HOSPITAL_COMMUNITY): Payer: Self-pay | Admitting: *Deleted

## 2014-11-06 DIAGNOSIS — L03012 Cellulitis of left finger: Secondary | ICD-10-CM | POA: Insufficient documentation

## 2014-11-06 DIAGNOSIS — Z79899 Other long term (current) drug therapy: Secondary | ICD-10-CM | POA: Insufficient documentation

## 2014-11-06 DIAGNOSIS — Z720 Tobacco use: Secondary | ICD-10-CM | POA: Insufficient documentation

## 2014-11-06 DIAGNOSIS — Z791 Long term (current) use of non-steroidal anti-inflammatories (NSAID): Secondary | ICD-10-CM | POA: Insufficient documentation

## 2014-11-06 DIAGNOSIS — Z792 Long term (current) use of antibiotics: Secondary | ICD-10-CM | POA: Insufficient documentation

## 2014-11-06 MED ORDER — NAPROXEN 500 MG PO TABS: 500.0000 mg | ORAL_TABLET | Freq: Two times a day (BID) | ORAL | Status: DC

## 2014-11-06 MED ORDER — LIDOCAINE HCL (PF) 1 % IJ SOLN
5.0000 mL | Freq: Once | INTRAMUSCULAR | Status: AC
  Administered 2014-11-06: 5 mL

## 2014-11-06 MED ORDER — LIDOCAINE HCL (PF) 1 % IJ SOLN
INTRAMUSCULAR | Status: AC
  Filled 2014-11-06: qty 5

## 2014-11-06 MED ORDER — IBUPROFEN 400 MG PO TABS
600.0000 mg | ORAL_TABLET | Freq: Once | ORAL | Status: AC
  Administered 2014-11-06: 600 mg via ORAL
  Filled 2014-11-06: qty 2

## 2014-11-06 MED ORDER — LIDOCAINE HCL (PF) 1 % IJ SOLN: 5.0000 mL | Freq: Once | INTRAMUSCULAR | Status: AC

## 2014-11-06 MED ORDER — LIDOCAINE HCL (PF) 1 % IJ SOLN
INTRAMUSCULAR | Status: AC
  Administered 2014-11-06: 5 mL
  Filled 2014-11-06: qty 5

## 2014-11-06 MED ORDER — SULFAMETHOXAZOLE-TRIMETHOPRIM 800-160 MG PO TABS
1.0000 | ORAL_TABLET | Freq: Once | ORAL | Status: AC
  Administered 2014-11-06: 1 via ORAL
  Filled 2014-11-06: qty 1

## 2014-11-06 MED ORDER — SULFAMETHOXAZOLE-TRIMETHOPRIM 800-160 MG PO TABS: 1.0000 | ORAL_TABLET | Freq: Two times a day (BID) | ORAL | Status: DC

## 2014-11-06 NOTE — Discharge Instructions (Signed)

## 2014-11-06 NOTE — ED Notes (Signed)
Pt states he has an infection of the left middle finger that started 3 days ago. Now throbbing & pain radiating up beyond wrist

## 2014-11-06 NOTE — ED Provider Notes (Signed)
CSN: [id number]     Arrival date & time 11/06/14  [id number] History   First MD Initiated Contact with Patient 11/06/14 0550     Chief Complaint  Patient presents with  . Hand Pain      HPI Patient presents to the emergency department complaining of 2-3 days of increasing pain of the distal aspect of his left middle finger on the radial aspect.  He states it started like a small area of redness and tenderness near his nail of the left finger.  History of abscess in the past.  No fevers or chills.  No spreading redness.  Throbbing pain is moderate in severity.  No pain with flexion or extension of the left finger   History reviewed. No pertinent past medical history. History reviewed. No pertinent past surgical history. No family history on file. History  Substance Use Topics  . Smoking status: Current Every Day Smoker -- 0.50 packs/day    Types: Cigarettes  . Smokeless tobacco: Not on file  . Alcohol Use: Yes     Comment: occassionally    Review of Systems  All other systems reviewed and are negative.     Allergies  Review of patient's allergies indicates no known allergies.  Home Medications   Prior to Admission medications   Medication Sig Start Date End Date Taking? Authorizing Provider  acetaminophen-codeine (TYLENOL #3) 300-30 MG per tablet Take 1-2 tablets by mouth every 6 (six) hours as needed. 09/21/14   Kathie DikeHobson M Bryant, PA-C  amoxicillin (AMOXIL) 500 MG capsule Take 1 capsule (500 mg total) by mouth 3 (three) times daily. 07/31/14   Burgess AmorJulie Idol, PA-C  amoxicillin-clavulanate (AUGMENTIN) 875-125 MG per tablet Take 1 tablet by mouth every 12 (twelve) hours. 04/26/14   Burgess AmorJulie Idol, PA-C  HYDROcodone-acetaminophen (NORCO/VICODIN) 5-325 MG per tablet Take 1 tablet by mouth every 4 (four) hours as needed. 07/31/14   Burgess AmorJulie Idol, PA-C  ibuprofen (ADVIL,MOTRIN) 800 MG tablet Take 1 tablet (800 mg total) by mouth 3 (three) times daily. 09/21/14   Kathie DikeHobson M Bryant, PA-C  naproxen  (NAPROSYN) 500 MG tablet Take 1 tablet (500 mg total) by mouth 2 (two) times daily. 11/06/14   Lyanne CoKevin M Marion Seese, MD  sulfamethoxazole-trimethoprim (SEPTRA DS) 800-160 MG per tablet Take 1 tablet by mouth every 12 (twelve) hours. 11/06/14   Lyanne CoKevin M Keonda Dow, MD   BP 124/98 mmHg  Pulse 68  Temp(Src) 97.6 F (36.4 C) (Oral)  Resp 18  Ht 5\' 9"  (1.753 m)  Wt 125 lb (56.7 kg)  BMI 18.45 kg/m2  SpO2 99% Physical Exam  Constitutional: He is oriented to person, place, and time. He appears well-developed and well-nourished.  HENT:  Head: Normocephalic.  Eyes: EOM are normal.  Neck: Normal range of motion.  Pulmonary/Chest: Effort normal.  Abdominal: He exhibits no distension.  Musculoskeletal: Normal range of motion.  Mild swelling on radial aspect of distal left middle finger near the nailbed. Full flexion and extension of left middle finger. Finger tip perfused. Mild erythema  Neurological: He is alert and oriented to person, place, and time.  Psychiatric: He has a normal mood and affect.  Nursing note and vitals reviewed.   ED Course  Procedures (including critical care time)  NERVE BLOCK Performed by: Lyanne CoAMPOS,Trendon Zaring M Consent: Verbal consent obtained. Required items: required blood products, implants, devices, and special equipment available Time out: Immediately prior to procedure a "time out" was called to verify the correct patient, procedure, equipment, support staff and site/side marked as required.  Indication: I and D Nerve block body site:  Preparation: Patient was prepped and draped in the usual sterile fashion. Needle gauge: 24 G Location technique: anatomical landmarks Local anesthetic: lidocaine 1% without epi Anesthetic total: 6 ml Outcome: pain improved Patient tolerance: Patient tolerated the procedure well with no immediate complications.   INCISION AND DRAINAGE Performed by: Lyanne CoAMPOS,Melah Ebling M Consent: Verbal consent obtained. Risks and benefits: risks, benefits and  alternatives were discussed Time out performed prior to procedure Type: abscess Body area: left middle finger  Anesthesia: nerve block (see note) with a scalpel. Complexity: simple Drainage: purulent Drainage amount: small Packing material: none Patient tolerance: Patient tolerated the procedure well with no immediate complications.     Labs Review Labs Reviewed - No data to display  Imaging Review No results found.   EKG Interpretation None      MDM   Final diagnoses:  Paronychia of finger of left hand    I and D after adequate anesthesia. Pus removed. Irrigated at bedside sink. Home on abx and NSAIDs. pcp follow up. Infection warnings given with return precautions    Lyanne CoKevin M Corney Knighton, MD 11/06/14 0630

## 2014-11-06 NOTE — ED Notes (Signed)
Pt alert & oriented x4, stable gait. Patient given discharge instructions, paperwork & prescription(s). Patient  instructed to stop at the registration desk to finish any additional paperwork. Patient verbalized understanding. Pt left department w/ no further questions. 

## 2015-02-10 ENCOUNTER — Emergency Department (HOSPITAL_COMMUNITY): Admission: EM | Admit: 2015-02-10 | Discharge: 2015-02-10 | Discharge status: Absent without leave | Payer: Self-pay

## 2015-04-28 ENCOUNTER — Encounter (HOSPITAL_COMMUNITY): Payer: Self-pay | Admitting: Emergency Medicine

## 2015-04-28 ENCOUNTER — Emergency Department (HOSPITAL_COMMUNITY)
Admission: EM | Admit: 2015-04-28 | Discharge: 2015-04-28 | Disposition: A | Discharge status: Home / Self Care | Payer: Medicaid Other | Attending: Emergency Medicine | Admitting: Emergency Medicine

## 2015-04-28 DIAGNOSIS — M25562 Pain in left knee: Secondary | ICD-10-CM | POA: Diagnosis present

## 2015-04-28 DIAGNOSIS — Z720 Tobacco use: Secondary | ICD-10-CM | POA: Diagnosis not present

## 2015-04-28 MED ORDER — OXYCODONE-ACETAMINOPHEN 5-325 MG PO TABS: 1.0000 | ORAL_TABLET | ORAL | Status: DC | PRN

## 2015-04-28 NOTE — ED Provider Notes (Signed)
CSN: [id number]     Arrival date & time 04/28/15  1759 History  This chart was scribed for non-physician practitioner, Pauline Aus, PA-C working with Gilda Crease, MD found by Placido Sou, ED scribe. This patient was seen in room APFT21/APFT21 and the patient's care was started at 7:29 PM     Chief Complaint  Patient presents with  . Knee Pain    The history is provided by the patient. No language interpreter was used.   HPI Comments: Ethan Little is a 24 y.o. male who presents to the Emergency Department complaining of constant, moderate left knee pain with onset 1 year prior and worsening pain 3 days PTA. Pt confirms pain radiating from front of knee down anterior of mid lower left leg. Pt takes over the counter pain and arthritis medications with no relief of symptoms. He went to urgent care earlier today and received x-rays with reported negative results. Urgent Care wouldn't prescribe pain medication and referred him to an orthopedist who was closed so patient came to ER for pain control. Pt confirms receiving a prescription for a brace but didn't purchase it and a prescription for prednisone which he did fill.    History reviewed. No pertinent past medical history. History reviewed. No pertinent past surgical history. History reviewed. No pertinent family history. History  Substance Use Topics  . Smoking status: Current Every Day Smoker -- 0.50 packs/day    Types: Cigarettes  . Smokeless tobacco: Not on file  . Alcohol Use: Yes     Comment: occassionally    Review of Systems  Constitutional: Negative for fever and chills.  Genitourinary: Negative for dysuria and difficulty urinating.  Musculoskeletal: Positive for arthralgias (left knee pain). Negative for joint swelling.  Skin: Negative for color change and wound.  All other systems reviewed and are negative.     Allergies  Hydrocodone  Home Medications   Prior to Admission medications    Medication Sig Start Date End Date Taking? Authorizing Provider  Acetaminophen (ARTHRITIS PAIN PO) Take 1 tablet by mouth daily as needed (for pain).   Yes Historical Provider, MD  acetaminophen (TYLENOL) 500 MG tablet Take 1,000 mg by mouth every 6 (six) hours as needed.   Yes Historical Provider, MD  prednisoLONE 5 MG TABS tablet Take 5-30 mg by mouth See admin instructions. Take as directed per package instructions (6,5,4,3,2,1) starting on 04/28/2015   Yes Historical Provider, MD  acetaminophen-codeine (TYLENOL #3) 300-30 MG per tablet Take 1-2 tablets by mouth every 6 (six) hours as needed. Patient not taking: Reported on 04/28/2015 09/21/14   Ivery Quale, PA-C  amoxicillin (AMOXIL) 500 MG capsule Take 1 capsule (500 mg total) by mouth 3 (three) times daily. Patient not taking: Reported on 04/28/2015 07/31/14   Burgess Amor, PA-C  amoxicillin-clavulanate (AUGMENTIN) 875-125 MG per tablet Take 1 tablet by mouth every 12 (twelve) hours. Patient not taking: Reported on 04/28/2015 04/26/14   Burgess Amor, PA-C  HYDROcodone-acetaminophen (NORCO/VICODIN) 5-325 MG per tablet Take 1 tablet by mouth every 4 (four) hours as needed. Patient not taking: Reported on 04/28/2015 07/31/14   Burgess Amor, PA-C  ibuprofen (ADVIL,MOTRIN) 800 MG tablet Take 1 tablet (800 mg total) by mouth 3 (three) times daily. Patient not taking: Reported on 04/28/2015 09/21/14   Ivery Quale, PA-C  naproxen (NAPROSYN) 500 MG tablet Take 1 tablet (500 mg total) by mouth 2 (two) times daily. Patient not taking: Reported on 04/28/2015 11/06/14   Azalia Bilis, MD  sulfamethoxazole-trimethoprim [facility]  DS) 800-160 MG per tablet Take 1 tablet by mouth every 12 (twelve) hours. Patient not taking: Reported on 04/28/2015 11/06/14   Azalia BilisKevin Campos, MD   BP 127/86 mmHg  Pulse 83  Temp(Src) 98.7 F (37.1 C) (Oral)  Resp 24  Ht 5\' 6"  (1.676 m)  Wt 125 lb (56.7 kg)  BMI 20.19 kg/m2  SpO2 100% Physical Exam  Constitutional: He is oriented to  person, place, and time. He appears well-developed and well-nourished. No distress.  HENT:  Head: Normocephalic and atraumatic.  Eyes: Conjunctivae and EOM are normal.  Neck: Neck supple.  Cardiovascular: Normal rate, regular rhythm, normal heart sounds and intact distal pulses.   Pulmonary/Chest: Effort normal and breath sounds normal. No respiratory distress.  Musculoskeletal: He exhibits no edema or tenderness.  Mild patella crepitus on ROM, no erythema, excessive warmth or effusion; no distal edema or tenderness  Neurological: He is alert and oriented to person, place, and time. He exhibits normal muscle tone. Coordination normal.  DP pulses strong and equal bilaterally  Skin: Skin is warm and dry. No erythema.  Psychiatric: His behavior is normal.  Nursing note and vitals reviewed.   ED Course  Procedures  DIAGNOSTIC STUDIES: Oxygen Saturation is 100% on RA, normal by my interpretation.    COORDINATION OF CARE: 7:37 PM Discussed treatment plan with pt at bedside including a knee wrap, Percocet and recommended applying ice to the affected area. Pt agreed to plan.  Labs Review Labs Reviewed - No data to display  Imaging Review No results found.   EKG Interpretation None      MDM   Final diagnoses:  Left knee pain    Pt with left knee pain and mild patellar crepitus on exam.  No concerning sx's for septic joint. No effusion.  Had imaging at another facility earlier today.  Further XR's are not indicated.  Pt has prednisone taper prescribed from urgent care.    ACE wrap applied for comfort, pt agrees to f/u with orthopedics this week   I personally performed the services described in this documentation, which was scribed in my presence. The recorded information has been reviewed and is accurate.    Pauline Ausammy Ahana Najera, PA-C 04/29/15 0107  Gilda Creasehristopher J Pollina, MD 05/01/15 [phone]

## 2015-04-28 NOTE — Discharge Instructions (Signed)
Knee Pain °Knee pain can be a result of an injury or other medical conditions. Treatment will depend on the cause of your pain. °HOME CARE °· Only take medicine as told by your doctor. °· Keep a healthy weight. Being overweight can make the knee hurt more. °· Stretch before exercising or playing sports. °· If there is constant knee pain, change the way you exercise. Ask your doctor for advice. °· Make sure shoes fit well. Choose the right shoe for the sport or activity. °· Protect your knees. Wear kneepads if needed. °· Rest when you are tired. °GET HELP RIGHT AWAY IF:  °· Your knee pain does not stop. °· Your knee pain does not get better. °· Your knee joint feels hot to the touch. °· You have a fever. °MAKE SURE YOU:  °· Understand these instructions. °· Will watch this condition. °· Will get help right away if you are not doing well or get worse. °Document Released: 02/18/2009 Document Revised: 02/14/2012 Document Reviewed: 02/18/2009 °[employer]® Patient Information ©2015 [employer]. This information is not intended to replace advice given to you by your health care provider. Make sure you discuss any questions you have with your health care provider. ° °

## 2015-04-28 NOTE — ED Notes (Addendum)
Patient complaining of left knee pain x 1 year, worsening 1 week ago. Patient states "I went to urgent care and they did x-rays and said they didn't see anything really going on, and they told me they couldn't give me anything for pain and that I would have to follow up with an orthopedic doctor but they were closed." States he was given prednisone at urgent care today. Patient denies recent injury. Patient ambulatory at triage.

## 2015-04-30 ENCOUNTER — Emergency Department (HOSPITAL_COMMUNITY)
Admission: EM | Admit: 2015-04-30 | Discharge: 2015-04-30 | Disposition: A | Discharge status: Home / Self Care | Payer: Medicaid Other | Attending: Emergency Medicine | Admitting: Emergency Medicine

## 2015-04-30 ENCOUNTER — Encounter (HOSPITAL_COMMUNITY): Payer: Self-pay | Admitting: Intensive Care

## 2015-04-30 DIAGNOSIS — Z720 Tobacco use: Secondary | ICD-10-CM | POA: Diagnosis not present

## 2015-04-30 DIAGNOSIS — M25562 Pain in left knee: Secondary | ICD-10-CM | POA: Insufficient documentation

## 2015-04-30 MED ORDER — TRAMADOL HCL 50 MG PO TABS: 50.0000 mg | ORAL_TABLET | Freq: Four times a day (QID) | ORAL | Status: DC | PRN

## 2015-04-30 MED ORDER — MELOXICAM 7.5 MG PO TABS: 7.5000 mg | ORAL_TABLET | Freq: Every day | ORAL | Status: DC

## 2015-04-30 NOTE — ED Notes (Signed)
Pt states he was seen here a few days ago and treated for knee pain. States he ran out of pain meds and has a appointment scheduled with orthopedic in two weeks.

## 2015-04-30 NOTE — Discharge Instructions (Signed)
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
## 2015-05-07 ENCOUNTER — Encounter (HOSPITAL_COMMUNITY): Payer: Self-pay | Admitting: Cardiology

## 2015-05-07 ENCOUNTER — Emergency Department (HOSPITAL_COMMUNITY)
Admission: EM | Admit: 2015-05-07 | Discharge: 2015-05-07 | Disposition: A | Discharge status: Home / Self Care | Payer: Medicaid Other | Attending: Emergency Medicine | Admitting: Emergency Medicine

## 2015-05-07 DIAGNOSIS — G8929 Other chronic pain: Secondary | ICD-10-CM | POA: Insufficient documentation

## 2015-05-07 DIAGNOSIS — Z720 Tobacco use: Secondary | ICD-10-CM | POA: Diagnosis not present

## 2015-05-07 DIAGNOSIS — M25562 Pain in left knee: Secondary | ICD-10-CM | POA: Diagnosis present

## 2015-05-07 DIAGNOSIS — Z79899 Other long term (current) drug therapy: Secondary | ICD-10-CM | POA: Insufficient documentation

## 2015-05-07 MED ORDER — OXYCODONE-ACETAMINOPHEN 5-325 MG PO TABS
1.0000 | ORAL_TABLET | Freq: Once | ORAL | Status: AC
  Administered 2015-05-07: 1 via ORAL
  Filled 2015-05-07: qty 1

## 2015-05-07 NOTE — ED Notes (Signed)
Pt made aware to return if symptoms worsen or if any life threatening symptoms occur.   
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
## 2015-05-07 NOTE — Discharge Instructions (Signed)
Knee Pain °Knee pain can be a result of an injury or other medical conditions. Treatment will depend on the cause of your pain. °HOME CARE °· Only take medicine as told by your doctor. °· Keep a healthy weight. Being overweight can make the knee hurt more. °· Stretch before exercising or playing sports. °· If there is constant knee pain, change the way you exercise. Ask your doctor for advice. °· Make sure shoes fit well. Choose the right shoe for the sport or activity. °· Protect your knees. Wear kneepads if needed. °· Rest when you are tired. °GET HELP RIGHT AWAY IF:  °· Your knee pain does not stop. °· Your knee pain does not get better. °· Your knee joint feels hot to the touch. °· You have a fever. °MAKE SURE YOU:  °· Understand these instructions. °· Will watch this condition. °· Will get help right away if you are not doing well or get worse. °Document Released: 02/18/2009 Document Revised: 02/14/2012 Document Reviewed: 02/18/2009 °[employer]® Patient Information ©2015 [employer]. This information is not intended to replace advice given to you by your health care provider. Make sure you discuss any questions you have with your health care provider. ° °

## 2015-05-13 ENCOUNTER — Encounter: Payer: Self-pay | Admitting: Orthopedic Surgery

## 2015-05-13 ENCOUNTER — Ambulatory Visit (INDEPENDENT_AMBULATORY_CARE_PROVIDER_SITE_OTHER): Payer: Medicaid Other | Admitting: Orthopedic Surgery

## 2015-05-13 VITALS — BP 111/74 | Ht 66.0 in | Wt 130.0 lb

## 2015-05-13 DIAGNOSIS — M25562 Pain in left knee: Secondary | ICD-10-CM | POA: Diagnosis not present

## 2015-05-13 DIAGNOSIS — G8929 Other chronic pain: Secondary | ICD-10-CM | POA: Diagnosis not present

## 2015-05-13 MED ORDER — MELOXICAM 7.5 MG PO TABS: ORAL_TABLET | ORAL | Status: DC

## 2015-05-13 NOTE — Progress Notes (Signed)
Patient ID: Ethan Little, male   DOB: 04/16/1991, 24 y.o.   MRN: [id number]   Chief Complaint  Patient presents with  . Knee Pain    left knee pain x 1 month, no known injury    HPI: This 24 year old male presents with a four-week history of pain in his left knee associated with instability giving out and constant 8 out of 10 sharp stabbing aching pain around his patellofemoral area. He had x-rays they were negative. He went to the emergency room and urgent care complaining of pain. He was treated with home exercises, oxycodone, meloxicam and Tylenol arthritis as well as tramadol he says it didn't help his pain.  He has a history of lumbar spine pain no workup noted x-rays negative in 2013 thought to have a pinched nerve in his back  ROS (2) weakness in his leg back pain joint pain anxiety denies fever denies burning pain tingling or numbness denies skin rashes bowel dysfunction bladder dysfunction  MED HX no major medical problems no previous surgery  Reports allergy to hydrocodone makes his face and ear lobes hot  PHYSICAL EXAM   VITAL SIGNS: BP 111/74 mmHg  Ht 5\' 6"  (1.676 m)  Wt 130 lb (58.968 kg)  BMI 20.99 kg/m2     GENERAL normal grooming and hygiene ectomorphic body habitus   MENTAL STATUS he was awake and alert and is oriented but his speech was sluggish and I thought I smell the remnants of marijuana but he says he does not use   MOOD/AFFECT ARE NORMAL    GAIT normal   EXAM OF THE left lower extremity  SKIN normal no tenderness no swelling  INSPECTION no tenderness no swelling  ROM normal  STABILITY normal  MOTOR GRADE 5/5   Right knee stability strength range of motion normal no swelling   VASC normal color and temperature both legs   NEURO normal sensation both legs  LYMPH not tested   IMAGING STUDIES CD disc was brought in with x-rays plain films normal report normal   Dx  Encounter Diagnosis  Name Primary?  . Knee pain, chronic,  left Yes   Continue meloxicam 7.5 mg twice a day to complete a four-week course continue home exercises come back in 3 weeks if no improvement I think will be able to get an MRI which was requested by urgent care but denied because he hadn't had any treatment and no trauma  I don't really find any major ligament cartilage abnormalities with his knee  A lumbar workup will probably be needed to confirm source of pain if they can even be found

## 2015-05-13 NOTE — Patient Instructions (Signed)
Medication sent to your pharmacy

## 2015-06-03 ENCOUNTER — Ambulatory Visit: Payer: Medicaid Other | Admitting: Orthopedic Surgery

## 2015-06-16 ENCOUNTER — Ambulatory Visit: Payer: Medicaid Other | Admitting: Orthopedic Surgery

## 2015-06-16 ENCOUNTER — Encounter: Payer: Self-pay | Admitting: Orthopedic Surgery

## 2015-07-04 ENCOUNTER — Emergency Department (HOSPITAL_COMMUNITY): Payer: Medicaid Other

## 2015-07-04 ENCOUNTER — Emergency Department (HOSPITAL_COMMUNITY)
Admission: EM | Admit: 2015-07-04 | Discharge: 2015-07-04 | Disposition: A | Discharge status: Home / Self Care | Payer: Medicaid Other | Attending: Emergency Medicine | Admitting: Emergency Medicine

## 2015-07-04 ENCOUNTER — Encounter (HOSPITAL_COMMUNITY): Payer: Self-pay | Admitting: Emergency Medicine

## 2015-07-04 DIAGNOSIS — Z79899 Other long term (current) drug therapy: Secondary | ICD-10-CM | POA: Insufficient documentation

## 2015-07-04 DIAGNOSIS — S39012A Strain of muscle, fascia and tendon of lower back, initial encounter: Secondary | ICD-10-CM | POA: Insufficient documentation

## 2015-07-04 DIAGNOSIS — W500XXA Accidental hit or strike by another person, initial encounter: Secondary | ICD-10-CM | POA: Diagnosis not present

## 2015-07-04 DIAGNOSIS — S24109A Unspecified injury at unspecified level of thoracic spinal cord, initial encounter: Secondary | ICD-10-CM | POA: Diagnosis not present

## 2015-07-04 DIAGNOSIS — Y998 Other external cause status: Secondary | ICD-10-CM | POA: Insufficient documentation

## 2015-07-04 DIAGNOSIS — S3992XA Unspecified injury of lower back, initial encounter: Secondary | ICD-10-CM | POA: Diagnosis present

## 2015-07-04 DIAGNOSIS — Y92321 Football field as the place of occurrence of the external cause: Secondary | ICD-10-CM | POA: Insufficient documentation

## 2015-07-04 DIAGNOSIS — Z791 Long term (current) use of non-steroidal anti-inflammatories (NSAID): Secondary | ICD-10-CM | POA: Diagnosis not present

## 2015-07-04 DIAGNOSIS — Y9361 Activity, american tackle football: Secondary | ICD-10-CM | POA: Diagnosis not present

## 2015-07-04 MED ORDER — NAPROXEN 500 MG PO TABS: 500.0000 mg | ORAL_TABLET | Freq: Two times a day (BID) | ORAL | Status: DC

## 2015-07-04 MED ORDER — METHOCARBAMOL 500 MG PO TABS: 1000.0000 mg | ORAL_TABLET | Freq: Four times a day (QID) | ORAL | Status: AC

## 2015-07-04 NOTE — ED Provider Notes (Signed)
CSN: [id number]     Arrival date & time 07/04/15  1538 History   First MD Initiated Contact with Patient 07/04/15 1545     Chief Complaint  Patient presents with  . Back Pain     (Consider location/radiation/quality/duration/timing/severity/associated sxs/prior Treatment) Patient is a 24 y.o. male presenting with back pain. The history is provided by the patient.  Back Pain Location:  Thoracic spine and lumbar spine Quality:  Shooting and aching Radiates to:  Does not radiate Pain severity:  Moderate Pain is:  Same all the time Onset quality:  Sudden Timing:  Constant Progression:  Worsening Chronicity:  New Context: falling   Context comment:  He reports being tackled from behind during a football game 2 days ago with the tackler landing across his back. Relieved by:  Being still Worsened by:  Bending, ambulation, movement and palpation Ineffective treatments: tylenol and rest. Associated symptoms: no abdominal pain, no bladder incontinence, no bowel incontinence, no chest pain, no dysuria, no fever, no leg pain, no numbness, no paresthesias, no pelvic pain and no weakness     History reviewed. No pertinent past medical history. History reviewed. No pertinent past surgical history. History reviewed. No pertinent family history. History  Substance Use Topics  . Smoking status: Current Every Day Smoker -- 0.50 packs/day    Types: Cigarettes  . Smokeless tobacco: Not on file  . Alcohol Use: Yes     Comment: occassionally    Review of Systems  Constitutional: Negative for fever.  Respiratory: Negative for shortness of breath.   Cardiovascular: Negative for chest pain and leg swelling.  Gastrointestinal: Negative for abdominal pain, constipation, abdominal distention and bowel incontinence.  Genitourinary: Negative for bladder incontinence, dysuria, urgency, frequency, flank pain, difficulty urinating and pelvic pain.  Musculoskeletal: Positive for back pain. Negative for  joint swelling, gait problem and neck stiffness.  Skin: Negative for rash.  Neurological: Negative for weakness, numbness and paresthesias.      Allergies  Hydrocodone  Home Medications   Prior to Admission medications   Medication Sig Start Date End Date Taking? Authorizing Provider  Acetaminophen (ARTHRITIS PAIN PO) Take 1 tablet by mouth daily as needed (for pain).    Historical Provider, MD  meloxicam (MOBIC) 7.5 MG tablet One tab by mouth twice daily 05/13/15   Vickki Hearing, MD  methocarbamol (ROBAXIN) 500 MG tablet Take 2 tablets (1,000 mg total) by mouth 4 (four) times daily. 07/04/15 07/14/15  Burgess Amor, PA-C  naproxen (NAPROSYN) 500 MG tablet Take 1 tablet (500 mg total) by mouth 2 (two) times daily. 07/04/15   Burgess Amor, PA-C  oxyCODONE-acetaminophen (PERCOCET/ROXICET) 5-325 MG per tablet Take 1 tablet by mouth every 4 (four) hours as needed. 04/28/15   Tammy Triplett, PA-C  traMADol (ULTRAM) 50 MG tablet Take 1 tablet (50 mg total) by mouth every 6 (six) hours as needed. 04/30/15   Burgess Amor, PA-C   BP 115/62 mmHg  Pulse 63  Temp(Src) 98.8 F (37.1 C) (Oral)  Resp 18  Ht  (1.702 m)  Wt 125 lb (56.7 kg)  BMI 19.57 kg/m2  SpO2 100% Physical Exam  Constitutional: He appears well-developed and well-nourished.  HENT:  Head: Normocephalic.  Eyes: Conjunctivae are normal.  Neck: Normal range of motion. Neck supple.  Cardiovascular: Normal rate and intact distal pulses.   Pedal pulses normal.  Pulmonary/Chest: Effort normal.  Abdominal: Soft. Bowel sounds are normal. He exhibits no distension and no mass.  Musculoskeletal: Normal range of motion. He  exhibits tenderness. He exhibits no edema.       Thoracic back: He exhibits tenderness and bony tenderness. He exhibits no spasm.       Lumbar back: He exhibits tenderness and bony tenderness. He exhibits no swelling, no edema and no spasm.  Pt has midline pain persistently tender down entire T and L spine midline.  No  focal area of increased pain.  No para thoracic or para lumbar spasm.  Neurological: He is alert. He has normal strength. He displays no atrophy and no tremor. No sensory deficit. Gait normal.  Reflex Scores:      Patellar reflexes are 2+ on the right side and 2+ on the left side.      Achilles reflexes are 2+ on the right side and 2+ on the left side. No strength deficit noted in hip and knee flexor and extensor muscle groups.  Ankle flexion and extension intact.  Skin: Skin is warm and dry.  Psychiatric: He has a normal mood and affect.  Nursing note and vitals reviewed.   ED Course  Procedures (including critical care time) Labs Review Labs Reviewed - No data to display  Imaging Review Dg Thoracic Spine 2 View  07/04/2015   CLINICAL DATA:  Back pain for 2 days following football injury, initial encounter  EXAM: THORACIC SPINE 2 VIEWS  COMPARISON:  None.  FINDINGS: There is no evidence of thoracic spine fracture. Alignment is normal. No other significant bone abnormalities are identified.  IMPRESSION: No acute abnormality noted.   Electronically Signed   By: Alcide Clever M.D.   On: 07/04/2015 16:55   Dg Lumbar Spine Complete  07/04/2015   CLINICAL DATA:  Pain after tackled in football game 2 days prior  EXAM: LUMBAR SPINE - COMPLETE 4+ VIEW  COMPARISON:  None.  FINDINGS: Frontal, lateral, spot lumbosacral lateral, and bilateral oblique views were obtained. There are 5 non-rib-bearing lumbar type vertebral bodies. There is no fracture or spondylolisthesis. Disc spaces appear intact. There is no appreciable facet arthropathy.  IMPRESSION: No fracture or spondylolisthesis. No appreciable arthropathic change.   Electronically Signed   By: Bretta Bang III M.D.   On: 07/04/2015 16:56     EKG Interpretation None      MDM   Final diagnoses:  Back strain, initial encounter    Patients labs and/or radiological studies were reviewed and considered during the medical decision making  and disposition process.   Imaging was reviewed, interpreted and I agree with radiologists reading.  Results were also discussed with patient. Pt was prescribed naproxen, robaxin , advised heat tx, rest, f/u if not improved over the next week.  Referral given for f/u and for pcp.  No neuro deficit on exam or by history to suggest emergent or surgical presentation.  Also discussed worsened sx that should prompt immediate re-evaluation including distal weakness, bowel/bladder retention/incontinence.          Burgess Amor, PA-C 07/04/15 1704  Glynn Octave, MD 07/04/15 [phone]

## 2015-07-04 NOTE — Discharge Instructions (Signed)
Back Pain, Adult °Low back pain is very common. About 1 in 5 people have back pain. The cause of low back pain is rarely dangerous. The pain often gets better over time. About half of people with a sudden onset of back pain feel better in just 2 weeks. About 8 in 10 people feel better by 6 weeks.  °CAUSES °Some common causes of back pain include: °· Strain of the muscles or ligaments supporting the spine. °· Wear and tear (degeneration) of the spinal discs. °· Arthritis. °· Direct injury to the back. °DIAGNOSIS °Most of the time, the direct cause of low back pain is not known. However, back pain can be treated effectively even when the exact cause of the pain is unknown. Answering your caregiver's questions about your overall health and symptoms is one of the most accurate ways to make sure the cause of your pain is not dangerous. If your caregiver needs more information, he or she may order lab work or imaging tests (X-rays or MRIs). However, even if imaging tests show changes in your back, this usually does not require surgery. °HOME CARE INSTRUCTIONS °For many people, back pain returns. Since low back pain is rarely dangerous, it is often a condition that people can learn to manage on their own.  °· Remain active. It is stressful on the back to sit or stand in one place. Do not sit, drive, or stand in one place for more than 30 minutes at a time. Take short walks on level surfaces as soon as pain allows. Try to increase the length of time you walk each day. °· Do not stay in bed. Resting more than 1 or 2 days can delay your recovery. °· Do not avoid exercise or work. Your body is made to move. It is not dangerous to be active, even though your back may hurt. Your back will likely heal faster if you return to being active before your pain is gone. °· Pay attention to your body when you  bend and lift. Many people have less discomfort when lifting if they bend their knees, keep the load close to their bodies, and  avoid twisting. Often, the most comfortable positions are those that put less stress on your recovering back. °· Find a comfortable position to sleep. Use a firm mattress and lie on your side with your knees slightly bent. If you lie on your back, put a pillow under your knees. °· Only take over-the-counter or prescription medicines as directed by your caregiver. Over-the-counter medicines to reduce pain and inflammation are often the most helpful. Your caregiver may prescribe muscle relaxant drugs. These medicines help dull your pain so you can more quickly return to your normal activities and healthy exercise. °· Put ice on the injured area. °¨ Put ice in a plastic bag. °¨ Place a towel between your skin and the bag. °¨ Leave the ice on for 15-20 minutes, 03-04 times a day for the first 2 to 3 days. After that, ice and heat may be alternated to reduce pain and spasms. °· Ask your caregiver about trying back exercises and gentle massage. This may be of some benefit. °· Avoid feeling anxious or stressed. Stress increases muscle tension and can worsen back pain. It is important to recognize when you are anxious or stressed and learn ways to manage it. Exercise is a great option. °SEEK MEDICAL CARE IF: °· You have pain that is not relieved with rest or medicine. °· You have pain that does not improve in 1 week. °· You have new symptoms. °· You are generally not feeling well. °SEEK   IMMEDIATE MEDICAL CARE IF:   You have pain that radiates from your back into your legs.  You develop new bowel or bladder control problems.  You have unusual weakness or numbness in your arms or legs.  You develop nausea or vomiting.  You develop abdominal pain.  You feel faint. Document Released: 11/22/2005 Document Revised: 05/23/2012 Document Reviewed: 03/26/2014 [facility] Patient Information 2015 [place], [place]. This information is not intended to replace advice given to you by your health care provider. Make sure you  discuss any questions you have with your health care provider.   Your xrays are negative today for acute injuries such as fracture, dislocation or obvious ligament or disk injury.   Use caution with robaxin as this may make you sleepy.  Avoid lifting,  Bending,  Twisting or any other activity that worsens your pain over the next week.  Apply an  icepack  to your lower back for 10-15 minutes every 2 hours for the next 2 days.  You should get rechecked if your symptoms are not better over the next week or you develop increased pain,  Weakness in your leg(s) or loss of bladder or bowel function - these are symptoms of a worse injury.

## 2015-07-04 NOTE — ED Notes (Signed)
Pt c/o mid back pain that starts below shoulders and radiates down his spine. Pt denies numbness or tingling to legs. Pain rated 7/10 stabbing. Pt was playing football 2 days ago and he got hit in the back.

## 2015-07-27 ENCOUNTER — Encounter (HOSPITAL_COMMUNITY): Payer: Self-pay | Admitting: Emergency Medicine

## 2015-07-27 ENCOUNTER — Emergency Department (HOSPITAL_COMMUNITY)
Admission: EM | Admit: 2015-07-27 | Discharge: 2015-07-27 | Disposition: A | Discharge status: Home / Self Care | Payer: Medicaid Other | Attending: Emergency Medicine | Admitting: Emergency Medicine

## 2015-07-27 DIAGNOSIS — R21 Rash and other nonspecific skin eruption: Secondary | ICD-10-CM | POA: Insufficient documentation

## 2015-07-27 DIAGNOSIS — Z720 Tobacco use: Secondary | ICD-10-CM | POA: Insufficient documentation

## 2015-07-27 DIAGNOSIS — Z79899 Other long term (current) drug therapy: Secondary | ICD-10-CM | POA: Insufficient documentation

## 2015-07-27 DIAGNOSIS — Z791 Long term (current) use of non-steroidal anti-inflammatories (NSAID): Secondary | ICD-10-CM | POA: Insufficient documentation

## 2015-07-27 MED ORDER — PREDNISONE 10 MG PO TABS
60.0000 mg | ORAL_TABLET | Freq: Once | ORAL | Status: AC
  Administered 2015-07-27: 60 mg via ORAL
  Filled 2015-07-27 (×2): qty 1

## 2015-07-27 MED ORDER — PREDNISONE 10 MG PO TABS: ORAL_TABLET | ORAL | Status: DC

## 2015-07-27 NOTE — Discharge Instructions (Signed)
Rash A rash is a change in the color or texture of your skin. There are many different types of rashes. You may have other problems that accompany your rash. CAUSES   Infections.  Allergic reactions. This can include allergies to pets or foods.  Certain medicines.  Exposure to certain chemicals, soaps, or cosmetics.  Heat.  Exposure to poisonous plants.  Tumors, both cancerous and noncancerous. SYMPTOMS   Redness.  Scaly skin.  Itchy skin.  Dry or cracked skin.  Bumps.  Blisters.  Pain. DIAGNOSIS  Your caregiver may do a physical exam to determine what type of rash you have. A skin sample (biopsy) may be taken and examined under a microscope. TREATMENT  Treatment depends on the type of rash you have. Your caregiver may prescribe certain medicines. For serious conditions, you may need to see a skin doctor (dermatologist). HOME CARE INSTRUCTIONS   Avoid the substance that caused your rash.  Do not scratch your rash. This can cause infection.  You may take cool baths to help stop itching.  Only take over-the-counter or prescription medicines as directed by your caregiver.  Keep all follow-up appointments as directed by your caregiver. SEEK IMMEDIATE MEDICAL CARE IF:  You have increasing pain, swelling, or redness.  You have a fever.  You have new or severe symptoms.  You have body aches, diarrhea, or vomiting.  Your rash is not better after 3 days. MAKE SURE YOU:  Understand these instructions.  Will watch your condition.  Will get help right away if you are not doing well or get worse. Document Released: 11/12/2002 Document Revised: 02/14/2012 Document Reviewed: 09/06/2011 [facility] Patient Information 2015 [place], [place]. This information is not intended to replace advice given to you by your health care provider. Make sure you discuss any questions you have with your health care provider.   Take your next dose of prednisone tomorrow evening.  You  may use benadryl 1 tablet every 6 hours if needed for itching.  Also, ice packs can help with itching as can [place] anti itch cream (or its generic equivalent.)

## 2015-07-27 NOTE — ED Provider Notes (Signed)
CSN: [id number]     Arrival date & time 07/27/15  1755 History  This chart was scribed for non-physician practitioner Burgess Amor, PA-C working with Vanetta Mulders, MD by Lyndel Safe, ED Scribe. This patient was seen in room APFT22/APFT22 and the patient's care was started at 6:30 PM.   Chief Complaint  Patient presents with  . Rash    The history is provided by the patient. No language interpreter was used.   HPI Comments: Ethan Little is a 24 y.o. male who presents to the Emergency Department complaining of sudden onset, progressively spreading, constant, pruritic, erythematous, raised areas to upper back, gluteal region, BLE, and lower abdominal region onset 2 days ago. The pt states he was outside when he was weedeating a field 3 days ago and may have been exposed to poison ivy or oak but also suspects possible chigger bites. He reports the pruritic rash started in his waist band region and has since spread to various other regions. The pt notes the pruritic quality is worse at night. He has applied calamine lotion and an OTC anti-pruritic lotion with no relief. Pt states others that also live in his household are not experiencing similar symptoms.  He denies fevers, throat swelling, contact with a similar rash or any pertinent PMhx.   History reviewed. No pertinent past medical history. History reviewed. No pertinent past surgical history. History reviewed. No pertinent family history. Social History  Substance Use Topics  . Smoking status: Current Every Day Smoker -- 0.50 packs/day    Types: Cigarettes  . Smokeless tobacco: None  . Alcohol Use: Yes     Comment: occassionally    Review of Systems  Constitutional: Negative for fever and chills.  HENT: Negative for trouble swallowing.   Respiratory: Negative for shortness of breath and wheezing.   Skin: Positive for rash.  Neurological: Negative for numbness.   Allergies  Hydrocodone  Home Medications   Prior to  Admission medications   Medication Sig Start Date End Date Taking? Authorizing Provider  Acetaminophen (ARTHRITIS PAIN PO) Take 1 tablet by mouth daily as needed (for pain).    Historical Provider, MD  meloxicam (MOBIC) 7.5 MG tablet One tab by mouth twice daily 05/13/15   Vickki Hearing, MD  naproxen (NAPROSYN) 500 MG tablet Take 1 tablet (500 mg total) by mouth 2 (two) times daily. 07/04/15   Burgess Amor, PA-C  oxyCODONE-acetaminophen (PERCOCET/ROXICET) 5-325 MG per tablet Take 1 tablet by mouth every 4 (four) hours as needed. 04/28/15   Tammy Triplett, PA-C  predniSONE (DELTASONE) 10 MG tablet 6, 5, 4, 3, 2 then 1 tablet by mouth daily for 6 days total. 07/28/15   Burgess Amor, PA-C  traMADol (ULTRAM) 50 MG tablet Take 1 tablet (50 mg total) by mouth every 6 (six) hours as needed. 04/30/15   Burgess Amor, PA-C   BP 121/77 mmHg  Pulse 53  Temp(Src) 98.1 F (36.7 C) (Oral)  Resp 16  Ht 5' 4.5" (1.638 m)  Wt 125 lb (56.7 kg)  BMI 21.13 kg/m2  SpO2 100% Physical Exam  Constitutional: He appears well-developed and well-nourished. No distress.  HENT:  Head: Normocephalic.  Mouth/Throat: Oropharynx is clear and moist. No oropharyngeal exudate.  Neck: Neck supple.  Cardiovascular: Normal rate.   Pulmonary/Chest: Effort normal. He has no wheezes.  Musculoskeletal: Normal range of motion. He exhibits no edema.  Skin: Rash noted.  Scattered erythematous papules around waist band, upper back, a few papules to the left side of  his neck, and on BLE. There are no pustules. Some of the lesions are vesicular. No surrounding erythema, no drainage. No involvement in intertriginous spaces.    ED Course  Procedures  DIAGNOSTIC STUDIES: Oxygen Saturation is 100% on RA, normal by my interpretation.    COORDINATION OF CARE: 6:45 PM Discussed treatment plan with pt. Advised pt he can take benadryl and use an OTC anti-itch cream. Will order and prescribe prednisone. Pt acknowledges and agrees to plan.   Labs  Review Labs Reviewed - No data to display   MDM   Final diagnoses:  Rash    Rash most c/w chigger bites given the location and scattered nature of the papules but I wouldn't expect chiggers to cause a progressive worsening rash.  It is possible he trimmed through poison plant of some kind disseminating the oils airborne which could explain the pattern.  He was placed on prednisone, encouraged benadryl, cool compresses, anti itch cream.  PRN f/u anticipated.    I personally performed the services described in this documentation, which was scribed in my presence. The recorded information has been reviewed and is accurate.   Burgess Amor, PA-C 07/27/15 2020  Vanetta Mulders, MD 07/27/15 [phone]

## 2015-07-27 NOTE — ED Notes (Signed)
Pt states that he thinks he might have poison oak, or chiggers --- Was weedeating a field -

## 2015-08-13 DIAGNOSIS — W228XXA Striking against or struck by other objects, initial encounter: Secondary | ICD-10-CM | POA: Insufficient documentation

## 2015-08-13 DIAGNOSIS — Y9302 Activity, running: Secondary | ICD-10-CM | POA: Diagnosis not present

## 2015-08-13 DIAGNOSIS — Y998 Other external cause status: Secondary | ICD-10-CM | POA: Insufficient documentation

## 2015-08-13 DIAGNOSIS — Y9289 Other specified places as the place of occurrence of the external cause: Secondary | ICD-10-CM | POA: Insufficient documentation

## 2015-08-13 DIAGNOSIS — Z720 Tobacco use: Secondary | ICD-10-CM | POA: Insufficient documentation

## 2015-08-13 DIAGNOSIS — Z791 Long term (current) use of non-steroidal anti-inflammatories (NSAID): Secondary | ICD-10-CM | POA: Insufficient documentation

## 2015-08-13 DIAGNOSIS — S0990XA Unspecified injury of head, initial encounter: Secondary | ICD-10-CM | POA: Insufficient documentation

## 2015-08-14 ENCOUNTER — Emergency Department (HOSPITAL_COMMUNITY)
Admission: EM | Admit: 2015-08-14 | Discharge: 2015-08-14 | Disposition: A | Discharge status: Home / Self Care | Payer: Medicaid Other | Attending: Emergency Medicine | Admitting: Emergency Medicine

## 2015-08-14 ENCOUNTER — Encounter (HOSPITAL_COMMUNITY): Payer: Self-pay | Admitting: Emergency Medicine

## 2015-08-14 ENCOUNTER — Emergency Department (HOSPITAL_COMMUNITY): Payer: Medicaid Other

## 2015-08-14 DIAGNOSIS — S0990XA Unspecified injury of head, initial encounter: Secondary | ICD-10-CM

## 2015-08-14 NOTE — ED Provider Notes (Signed)
CSN: [id number]   Arrival date & time 08/13/15 2339  History  This chart was scribed for Geoffery Lyons, MD by [name], ED Scribe. This patient was seen in room APA12/APA12 and the patient's care was started at 12:49 AM.  Chief Complaint  Patient presents with  . Headache    HPI The history is provided by the patient. No language interpreter was used.   Ethan Little is a 24 y.o. male with no significant PMHx who presents to the Emergency Department complaining of headache with sudden onset yesterday after unintentionally striking himself in the head with a metal pipe. He was attempting to bend the pipe and hit himself in the top of the head. He was able to carry out his normal activities over the day.  Associated symptoms include mild nausea, dizziness, generalized weakness, and fatigue. Pt denies vomiting, change in vision, and change in hearing.   History reviewed. No pertinent past medical history.  History reviewed. No pertinent past surgical history.  History reviewed. No pertinent family history.  Social History  Substance Use Topics  . Smoking status: Current Every Day Smoker -- 0.50 packs/day    Types: Cigarettes  . Smokeless tobacco: None  . Alcohol Use: Yes     Comment: occassionally     Review of Systems 10 Systems reviewed and all are negative for acute change except as noted in the HPI. Home Medications   Prior to Admission medications   Medication Sig Start Date End Date Taking? Authorizing Provider  Acetaminophen (ARTHRITIS PAIN PO) Take 1 tablet by mouth daily as needed (for pain).    Historical Provider, MD  meloxicam (MOBIC) 7.5 MG tablet One tab by mouth twice daily 05/13/15   Vickki Hearing, MD  naproxen (NAPROSYN) 500 MG tablet Take 1 tablet (500 mg total) by mouth 2 (two) times daily. 07/04/15   Burgess Amor, PA-C  oxyCODONE-acetaminophen (PERCOCET/ROXICET) 5-325 MG per tablet Take 1 tablet by mouth every 4 (four) hours as needed. 04/28/15   Tammy  Triplett, PA-C  predniSONE (DELTASONE) 10 MG tablet 6, 5, 4, 3, 2 then 1 tablet by mouth daily for 6 days total. 07/28/15   Burgess Amor, PA-C  traMADol (ULTRAM) 50 MG tablet Take 1 tablet (50 mg total) by mouth every 6 (six) hours as needed. 04/30/15   Burgess Amor, PA-C    Allergies  Hydrocodone  Triage Vitals: BP 112/84 mmHg  Pulse 64  Temp(Src) 97.8 F (36.6 C)  Resp 18  Ht 5\' 4"  (1.626 m)  Wt 125 lb (56.7 kg)  BMI 21.45 kg/m2  SpO2 100%  Physical Exam  Constitutional: He is oriented to person, place, and time. He appears well-developed and well-nourished.  HENT:  Head: Normocephalic.  Eyes: EOM are normal.  Neck: Normal range of motion.  Pulmonary/Chest: Effort normal.  Abdominal: He exhibits no distension. There is no tenderness.  Musculoskeletal: Normal range of motion.  Neurological: He is alert and oriented to person, place, and time. No cranial nerve deficit. He exhibits normal muscle tone. Coordination normal.  5/5 strength bilateral upper and lower extremities  Psychiatric: He has a normal mood and affect. Judgment normal.  Nursing note and vitals reviewed.   ED Course  Procedures   DIAGNOSTIC STUDIES: Oxygen Saturation is 100% on RA, normal by my interpretation.    COORDINATION OF CARE: 12:53 AM Discussed treatment plan with pt at bedside and pt agreed to plan.  Labs Reviewed - No data to display  Imaging Review No results  found.    MDM   Final diagnoses:  None     Patient presents with complaints of headache, dizziness, and nausea after inadvertently striking himself with a pipe in the head yesterday. He is neurologically intact and CT scan is negative. He will be discharged with instructions to take acetaminophen as needed for pain and return for any problems.   I personally performed the services described in this documentation, which was scribed in my presence. The recorded information has been reviewed and is accurate.    Geoffery Lyons,  MD 08/14/15 [phone]

## 2015-08-14 NOTE — Discharge Instructions (Signed)
Tylenol 1000 mg every 6 hours as needed for headache.  Return to the ER symptoms significantly worsen or change.   Concussion A concussion, or closed-head injury, is a brain injury caused by a direct blow to the head or by a quick and sudden movement (jolt) of the head or neck. Concussions are usually not life-threatening. Even so, the effects of a concussion can be serious. If you have had a concussion before, you are more likely to experience concussion-like symptoms after a direct blow to the head.  CAUSES  Direct blow to the head, such as from running into another player during a soccer game, being hit in a fight, or hitting your head on a hard surface.  A jolt of the head or neck that causes the brain to move back and forth inside the skull, such as in a car crash. SIGNS AND SYMPTOMS The signs of a concussion can be hard to notice. Early on, they may be missed by you, family members, and health care providers. You may look fine but act or feel differently. Symptoms are usually temporary, but they may last for days, weeks, or even longer. Some symptoms may appear right away while others may not show up for hours or days. Every head injury is different. Symptoms include:  Mild to moderate headaches that will not go away.  A feeling of pressure inside your head.  Having more trouble than usual:  Learning or remembering things you have heard.  Answering questions.  Paying attention or concentrating.  Organizing daily tasks.  Making decisions and solving problems.  Slowness in thinking, acting or reacting, speaking, or reading.  Getting lost or being easily confused.  Feeling tired all the time or lacking energy (fatigued).  Feeling drowsy.  Sleep disturbances.  Sleeping more than usual.  Sleeping less than usual.  Trouble falling asleep.  Trouble sleeping (insomnia).  Loss of balance or feeling lightheaded or dizzy.  Nausea or vomiting.  Numbness or  tingling.  Increased sensitivity to:  Sounds.  Lights.  Distractions.  Vision problems or eyes that tire easily.  Diminished sense of taste or smell.  Ringing in the ears.  Mood changes such as feeling sad or anxious.  Becoming easily irritated or angry for little or no reason.  Lack of motivation.  Seeing or hearing things other people do not see or hear (hallucinations). DIAGNOSIS Your health care provider can usually diagnose a concussion based on a description of your injury and symptoms. He or she will ask whether you passed out (lost consciousness) and whether you are having trouble remembering events that happened right before and during your injury. Your evaluation might include:  A brain scan to look for signs of injury to the brain. Even if the test shows no injury, you may still have a concussion.  Blood tests to be sure other problems are not present. TREATMENT  Concussions are usually treated in an emergency department, in urgent care, or at a clinic. You may need to stay in the hospital overnight for further treatment.  Tell your health care provider if you are taking any medicines, including prescription medicines, over-the-counter medicines, and natural remedies. Some medicines, such as blood thinners (anticoagulants) and aspirin, may increase the R[place] of complications. Also tell your health care provider whether you have had alcohol or are taking illegal drugs. This information may affect treatment.  Your health care provider will send you home with important instructions to follow.  How fast you will recover from  a concussion depends on many factors. These factors include how severe your concussion is, what part of your brain was injured, your age, and how healthy you were before the concussion.  Most people with mild injuries recover fully. Recovery can take time. In general, recovery is slower in older persons. Also, persons who have had a concussion in  the past or have other medical problems may find that it takes longer to recover from their current injury. HOME CARE INSTRUCTIONS General Instructions  Carefully follow the directions your health care provider gave you.  Only take over-the-counter or prescription medicines for pain, discomfort, or fever as directed by your health care provider.  Take only those medicines that your health care provider has approved.  Do not drink alcohol until your health care provider says you are well enough to do so. Alcohol and certain other drugs may slow your recovery and can put you at risk of further injury.  If it is harder than usual to remember things, write them down.  If you are easily distracted, try to do one thing at a time. For example, do not try to watch TV while fixing dinner.  Talk with family members or close friends when making important decisions.  Keep all follow-up appointments. Repeated evaluation of your symptoms is recommended for your recovery.  Watch your symptoms and tell others to do the same. Complications sometimes occur after a concussion. Older adults with a brain injury may have a higher risk of serious complications, such as a blood clot on the brain.  Tell your teachers, school nurse, school counselor, coach, athletic trainer, or work Freight forwarder about your injury, symptoms, and restrictions. Tell them about what you can or cannot do. They should watch for:  Increased problems with attention or concentration.  Increased difficulty remembering or learning new information.  Increased time needed to complete tasks or assignments.  Increased irritability or decreased ability to cope with stress.  Increased symptoms.  Rest. Rest helps the brain to heal. Make sure you:  Get plenty of sleep at night. Avoid staying up late at night.  Keep the same bedtime hours on weekends and weekdays.  Rest during the day. Take daytime naps or rest breaks when you feel  tired.  Limit activities that require a lot of thought or concentration. These include:  Doing homework or job-related work.  Watching TV.  Working on the computer.  Avoid any situation where there is potential for another head injury (football, hockey, soccer, basketball, martial arts, downhill snow sports and horseback riding). Your condition will get worse every time you experience a concussion. You should avoid these activities until you are evaluated by the appropriate follow-up health care providers. Returning To Your Regular Activities You will need to return to your normal activities slowly, not all at once. You must give your body and brain enough time for recovery.  Do not return to sports or other athletic activities until your health care provider tells you it is safe to do so.  Ask your health care provider when you can drive, ride a bicycle, or operate heavy machinery. Your ability to react may be slower after a brain injury. Never do these activities if you are dizzy.  Ask your health care provider about when you can return to work or school. Preventing Another Concussion It is very important to avoid another brain injury, especially before you have recovered. In rare cases, another injury can lead to permanent brain damage, brain swelling, or death.  The risk of this is greatest during the first 7-10 days after a head injury. Avoid injuries by:  Wearing a seat belt when riding in a car.  Drinking alcohol only in moderation.  Wearing a helmet when biking, skiing, skateboarding, skating, or doing similar activities.  Avoiding activities that could lead to a second concussion, such as contact or recreational sports, until your health care provider says it is okay.  Taking safety measures in your home.  Remove clutter and tripping hazards from floors and stairways.  Use grab bars in bathrooms and handrails by stairs.  Place non-slip mats on floors and in  bathtubs.  Improve lighting in dim areas. SEEK MEDICAL CARE IF:  You have increased problems paying attention or concentrating.  You have increased difficulty remembering or learning new information.  You need more time to complete tasks or assignments than before.  You have increased irritability or decreased ability to cope with stress.  You have more symptoms than before. Seek medical care if you have any of the following symptoms for more than 2 weeks after your injury:  Lasting (chronic) headaches.  Dizziness or balance problems.  Nausea.  Vision problems.  Increased sensitivity to noise or light.  Depression or mood swings.  Anxiety or irritability.  Memory problems.  Difficulty concentrating or paying attention.  Sleep problems.  Feeling tired all the time. SEEK IMMEDIATE MEDICAL CARE IF:  You have severe or worsening headaches. These may be a sign of a blood clot in the brain.  You have weakness (even if only in one hand, leg, or part of the face).  You have numbness.  You have decreased coordination.  You vomit repeatedly.  You have increased sleepiness.  One pupil is larger than the other.  You have convulsions.  You have slurred speech.  You have increased confusion. This may be a sign of a blood clot in the brain.  You have increased restlessness, agitation, or irritability.  You are unable to recognize people or places.  You have neck pain.  It is difficult to wake you up.  You have unusual behavior changes.  You lose consciousness. MAKE SURE YOU:  Understand these instructions.  Will watch your condition.  Will get help right away if you are not doing well or get worse. Document Released: 02/12/2004 Document Revised: 11/27/2013 Document Reviewed: 06/14/2013 [facility] Patient Information 2015 [place], [place]. This information is not intended to replace advice given to you by your health care provider. Make sure you discuss any  questions you have with your health care provider.

## 2015-08-14 NOTE — ED Notes (Signed)
Pt c/o head pain since accidentally hitting himself in head with metal pipe and running into door yesterday.

## 2015-11-07 ENCOUNTER — Encounter: Payer: Self-pay | Admitting: Family Medicine

## 2015-11-07 ENCOUNTER — Ambulatory Visit (INDEPENDENT_AMBULATORY_CARE_PROVIDER_SITE_OTHER): Payer: Medicaid Other | Admitting: Family Medicine

## 2015-11-07 VITALS — BP 112/78 | HR 68 | Temp 98.0°F | Resp 12 | Wt 118.6 lb

## 2015-11-07 DIAGNOSIS — M546 Pain in thoracic spine: Principal | ICD-10-CM

## 2015-11-07 DIAGNOSIS — W57XXXA Bitten or stung by nonvenomous insect and other nonvenomous arthropods, initial encounter: Secondary | ICD-10-CM

## 2015-11-07 DIAGNOSIS — M545 Low back pain, unspecified: Secondary | ICD-10-CM

## 2015-11-07 DIAGNOSIS — S0086XA Insect bite (nonvenomous) of other part of head, initial encounter: Secondary | ICD-10-CM

## 2015-11-07 MED ORDER — MELOXICAM 15 MG PO TABS: 15.0000 mg | ORAL_TABLET | Freq: Every day | ORAL | Status: DC

## 2015-11-07 MED ORDER — HYDROXYZINE HCL 10 MG PO TABS: 10.0000 mg | ORAL_TABLET | Freq: Three times a day (TID) | ORAL | Status: DC | PRN

## 2015-11-07 MED ORDER — CYCLOBENZAPRINE HCL 5 MG PO TABS: 5.0000 mg | ORAL_TABLET | Freq: Every day | ORAL | Status: DC

## 2015-11-07 NOTE — Progress Notes (Signed)
Name: Ethan Little   MRN: [id number]    DOB: 1991-01-24   Date:11/07/2015       Progress Note  Subjective  Chief Complaint  Chief Complaint  Patient presents with  . Establish Care  . Back Pain    was seen at ortho about 6 months ago for his knee but was told it was not his knee it's his back.  . Skin Problem    questions bite on back. area itches but does not burn.    HPI  Ethan Little is a 24 year old male here today to establish care and discuss his back pain. Patient has been seen at ER over the years for this on and off. X-rays normal of thoracic and lumbar spine 07/04/15. Also seen by Orthopedic specialist for knee pain with no significant findings.   Also has an area of skin on her back that is pruritic. Onset yesterday.    Past Medical History  Diagnosis Date  . Back pain   . Anxiety     history of  . ADHD (attention deficit hyperactivity disorder)     patient has not been to a doctor since high school for this     There are no active problems to display for this patient.   Social History  Substance Use Topics  . Smoking status: Current Every Day Smoker -- 0.50 packs/day    Types: Cigarettes  . Smokeless tobacco: Not on file  . Alcohol Use: 0.0 oz/week    0 Standard drinks or equivalent per week     Comment: occassionally     Current outpatient prescriptions:  .  meloxicam (MOBIC) 7.5 MG tablet, One tab by mouth twice daily, Disp: 63 tablet, Rfl: 0  History reviewed. No pertinent past surgical history.  Family History  Problem Relation Age of Onset  . Family history unknown: Yes    Allergies  Allergen Reactions  . Hydrocodone Other (See Comments)    "irritable and makes my face hot"     Review of Systems  CONSTITUTIONAL: No significant weight changes, fever, chills, weakness or fatigue.  HEENT:  - Eyes: No visual changes.  - Ears: No auditory changes. No pain.  - Nose: No sneezing, congestion, runny nose. - Throat: No sore throat. No  changes in swallowing. SKIN: Yes skin lesion. CARDIOVASCULAR: No chest pain, chest pressure or chest discomfort. No palpitations or edema.  RESPIRATORY: No shortness of breath, cough or sputum.  GASTROINTESTINAL: No anorexia, nausea, vomiting. No changes in bowel habits. No abdominal pain or blood.  NEUROLOGICAL: No headache, dizziness, syncope, paralysis, ataxia, numbness or tingling in the extremities. No memory changes. No change in bowel or bladder control.  MUSCULOSKELETAL: Yes joint pain. No muscle pain. HEMATOLOGIC: No anemia, bleeding or bruising.  LYMPHATICS: No enlarged lymph nodes.  PSYCHIATRIC: No change in mood. No change in sleep pattern.  ENDOCRINOLOGIC: No reports of sweating, cold or heat intolerance. No polyuria or polydipsia.     Objective  BP 112/78 mmHg  Pulse 68  Temp(Src) 98 F (36.7 C) (Oral)  Resp 12  Wt 118 lb 9.6 oz (53.797 kg)  SpO2 98% Body mass index is 20.35 kg/(m^2).  Physical Exam  Constitutional: Patient appears well-developed and well-nourished. In no distress.  Cardiovascular: Normal rate, regular rhythm and normal heart sounds.  No murmur heard.  Pulmonary/Chest: Effort normal and breath sounds normal. No respiratory distress. Musculoskeletal: Normal range of motion bilateral UE and LE, no joint effusions. Spinal exam thoracic and lumbar  with normal curvature and no palpable step off.  Peripheral vascular: Bilateral LE no edema. Neurological: CN II-XII grossly intact with no focal deficits. Alert and oriented to person, place, and time. Coordination, balance, strength, speech and gait are normal.  Skin: Skin is warm and dry. Right lower back 2 raised red lesion without opening, pus, drainage. Psychiatric: Patient has a normal mood and affect. Behavior is normal in office today. Judgment and thought content normal in office today.  Assessment & Plan  1. Thoracolumbar back pain Pain reported by patient is unexplained. I do not see any evidence  on x-ray imaging available for me to review from his ER visits to justify the pain he reports. I question if he is drug seeking for narcotic medication. I offered him advice of conservative therapy, PT referral (he declined), muscle relaxer use prn.   Other thoughts would be possible juvenile rheumatological etiology not yet diagnosed however I would think the x-rays would have revealed some irregularities.   - meloxicam (MOBIC) 15 MG tablet; Take 1 tablet (15 mg total) by mouth daily.  Dispense: 30 tablet; Refill: 2 - cyclobenzaprine (FLEXERIL) 5 MG tablet; Take 1 tablet (5 mg total) by mouth at bedtime.  Dispense: 30 tablet; Refill: 2  2. Bug bite of face without infection, initial encounter Keep area clean, it is not infected. May use topical neosporin if needed. Atarax for itching.   - hydrOXYzine (ATARAX/VISTARIL) 10 MG tablet; Take 1 tablet (10 mg total) by mouth 3 (three) times daily as needed for itching.  Dispense: 30 tablet; Refill: 0

## 2015-11-12 ENCOUNTER — Emergency Department (HOSPITAL_COMMUNITY)
Admission: EM | Admit: 2015-11-12 | Discharge: 2015-11-12 | Disposition: A | Discharge status: Home / Self Care | Payer: Medicaid Other | Attending: Emergency Medicine | Admitting: Emergency Medicine

## 2015-11-12 ENCOUNTER — Encounter (HOSPITAL_COMMUNITY): Payer: Self-pay | Admitting: *Deleted

## 2015-11-12 DIAGNOSIS — H00012 Hordeolum externum right lower eyelid: Secondary | ICD-10-CM | POA: Diagnosis not present

## 2015-11-12 DIAGNOSIS — Z8659 Personal history of other mental and behavioral disorders: Secondary | ICD-10-CM | POA: Diagnosis not present

## 2015-11-12 DIAGNOSIS — R590 Localized enlarged lymph nodes: Secondary | ICD-10-CM | POA: Diagnosis not present

## 2015-11-12 DIAGNOSIS — L0201 Cutaneous abscess of face: Secondary | ICD-10-CM | POA: Insufficient documentation

## 2015-11-12 DIAGNOSIS — H00013 Hordeolum externum right eye, unspecified eyelid: Secondary | ICD-10-CM

## 2015-11-12 DIAGNOSIS — F17210 Nicotine dependence, cigarettes, uncomplicated: Secondary | ICD-10-CM | POA: Insufficient documentation

## 2015-11-12 DIAGNOSIS — L0291 Cutaneous abscess, unspecified: Secondary | ICD-10-CM

## 2015-11-12 DIAGNOSIS — Z791 Long term (current) use of non-steroidal anti-inflammatories (NSAID): Secondary | ICD-10-CM | POA: Diagnosis not present

## 2015-11-12 MED ORDER — ERYTHROMYCIN 5 MG/GM OP OINT
TOPICAL_OINTMENT | Freq: Once | OPHTHALMIC | Status: AC
  Administered 2015-11-12: 02:00:00 via OPHTHALMIC
  Filled 2015-11-12: qty 3.5

## 2015-11-12 MED ORDER — DOXYCYCLINE HYCLATE 100 MG PO CAPS: 100.0000 mg | ORAL_CAPSULE | Freq: Two times a day (BID) | ORAL | Status: DC

## 2015-11-12 MED ORDER — DICLOFENAC SODIUM 50 MG PO TBEC: 50.0000 mg | DELAYED_RELEASE_TABLET | Freq: Two times a day (BID) | ORAL | Status: DC

## 2015-11-12 MED ORDER — DOXYCYCLINE HYCLATE 100 MG PO TABS
100.0000 mg | ORAL_TABLET | Freq: Once | ORAL | Status: AC
  Administered 2015-11-12: 100 mg via ORAL
  Filled 2015-11-12: qty 1

## 2015-11-12 NOTE — ED Provider Notes (Signed)
CSN: [id number]     Arrival date & time 11/12/15  0005 History   First MD Initiated Contact with Patient 11/12/15 0038     Chief Complaint  Patient presents with  . Abscess     (Consider location/radiation/quality/duration/timing/severity/associated sxs/prior Treatment) Patient is a 24 y.o. male presenting with abscess and eye problem. The history is provided by the patient.  Abscess Location:  Head/neck Head/neck abscess location:  R ear Abscess quality: painful   Red streaking: no   Progression:  Worsening Eye Problem Location:  R eye Quality:  Stinging Severity:  Mild Onset quality:  Gradual Duration:  3 days Timing:  Constant Progression:  Worsening Chronicity:  New Relieved by:  None tried Worsened by:  Nothing tried Ineffective treatments:  None tried Associated symptoms: itching and redness    Ethan Little is a 24 y.o. male who presents to the ED with a swollen tender area to the back of the right ear that started a few days ago and a sty to the right lower eyelid. He reports that he had an infected bug bite last week to his right buttock and his doctor gave him a pack of antibiotic ointment to use on the area. That got better but then the other areas started.   Past Medical History  Diagnosis Date  . Back pain   . Anxiety     history of  . ADHD (attention deficit hyperactivity disorder)     patient has not been to a doctor since high school for this    History reviewed. No pertinent past surgical history. Family History  Problem Relation Age of Onset  . Family history unknown: Yes   Social History  Substance Use Topics  . Smoking status: Current Every Day Smoker -- 0.50 packs/day    Types: Cigarettes  . Smokeless tobacco: None  . Alcohol Use: 0.0 oz/week    0 Standard drinks or equivalent per week     Comment: occassionally    Review of Systems  Eyes: Positive for redness and itching.  Skin: Positive for wound.  all other systems  negative    Allergies  Hydrocodone  Home Medications   Prior to Admission medications   Medication Sig Start Date End Date Taking? Authorizing Provider  cyclobenzaprine (FLEXERIL) 5 MG tablet Take 1 tablet (5 mg total) by mouth at bedtime. 11/07/15   Edwena Felty, MD  diclofenac (VOLTAREN) 50 MG EC tablet Take 1 tablet (50 mg total) by mouth 2 (two) times daily. 11/12/15   Amna Welker Orlene Och, NP  doxycycline (VIBRAMYCIN) 100 MG capsule Take 1 capsule (100 mg total) by mouth 2 (two) times daily. 11/12/15   Trashaun Streight Orlene Och, NP  hydrOXYzine (ATARAX/VISTARIL) 10 MG tablet Take 1 tablet (10 mg total) by mouth 3 (three) times daily as needed for itching. 11/07/15   Edwena Felty, MD  meloxicam (MOBIC) 15 MG tablet Take 1 tablet (15 mg total) by mouth daily. 11/07/15   Edwena Felty, MD   BP 114/73 mmHg  Pulse 65  Temp(Src) 97.9 F (36.6 C) (Oral)  Resp 20  Ht  (1.626 m)  Wt 53.524 kg  BMI 20.24 kg/m2  SpO2 98% Physical Exam  Constitutional: He is oriented to person, place, and time. He appears well-developed and well-nourished. No distress.  HENT:  Head:    Right Ear: Tympanic membrane normal.  Left Ear: Tympanic membrane normal.  Nose: Nose normal.  Mouth/Throat: Uvula is midline, oropharynx is clear and moist and mucous membranes are  normal.  There is a 1 cm raised, firm, tender area to the area just behind the right ear. There is mild erythema, no red streaking and a small scab in the center of the tender area.   Eyes: EOM are normal. Pupils are equal, round, and reactive to light. Lids are everted and swept, no foreign bodies found. Right eye exhibits hordeolum. Right conjunctiva is injected.    Neck: Neck supple.  Cardiovascular: Normal rate.   Pulmonary/Chest: Effort normal.  Musculoskeletal: Normal range of motion.  Lymphadenopathy:    He has cervical adenopathy.  Neurological: He is alert and oriented to person, place, and time. No cranial nerve deficit.  Skin: Skin is  warm and dry.  Psychiatric: He has a normal mood and affect. His behavior is normal.  Nursing note and vitals reviewed.   ED Course  Procedures  Erythromycin opth ointment with first dose being instilled prior to d/c. Patient will use tid for the next week. Doxycycline PO MDM  24 y.o. male with small abscess to the right side of the head just posterior to the right ear. And sty to the right lower eyelid. Discussed with the patient clinical findings and plan of care and all questioned fully answered. He will follow up with opthalmology if the sty persist and with his PCP for the abscess or he will return here  if any problems arise. Stable for d/c without mastoid tenderness, no change in vision and does not appear toxic.   Final diagnoses:  Abscess  Hordeolum, right       [facility] Henry Utsey, NP 11/12/15 0120  Devoria AlbeIva Knapp, MD 11/12/15 [phone]

## 2015-11-12 NOTE — Discharge Instructions (Signed)
Use the eye ointment 3 times a day for the next week.  Apply warm wet compresses to the abscess area several times a day.  Follow up with your doctor, return here as needed.

## 2015-11-12 NOTE — ED Notes (Signed)
Pt reporting abscess behind right ear.  Also reports that he has a stye in right eye.

## 2016-01-20 ENCOUNTER — Emergency Department (HOSPITAL_COMMUNITY)
Admission: EM | Admit: 2016-01-20 | Discharge: 2016-01-21 | Disposition: A | Discharge status: Home / Self Care | Payer: Medicaid Other | Attending: Emergency Medicine | Admitting: Emergency Medicine

## 2016-01-20 ENCOUNTER — Encounter (HOSPITAL_COMMUNITY): Payer: Self-pay

## 2016-01-20 DIAGNOSIS — Z791 Long term (current) use of non-steroidal anti-inflammatories (NSAID): Secondary | ICD-10-CM | POA: Diagnosis not present

## 2016-01-20 DIAGNOSIS — Z792 Long term (current) use of antibiotics: Secondary | ICD-10-CM | POA: Diagnosis not present

## 2016-01-20 DIAGNOSIS — R109 Unspecified abdominal pain: Secondary | ICD-10-CM | POA: Diagnosis not present

## 2016-01-20 DIAGNOSIS — M545 Low back pain: Secondary | ICD-10-CM | POA: Insufficient documentation

## 2016-01-20 DIAGNOSIS — R112 Nausea with vomiting, unspecified: Secondary | ICD-10-CM | POA: Diagnosis not present

## 2016-01-20 DIAGNOSIS — F419 Anxiety disorder, unspecified: Secondary | ICD-10-CM | POA: Diagnosis not present

## 2016-01-20 DIAGNOSIS — F17210 Nicotine dependence, cigarettes, uncomplicated: Secondary | ICD-10-CM | POA: Diagnosis not present

## 2016-01-20 LAB — COMPREHENSIVE METABOLIC PANEL
ALBUMIN: 4.4 g/dL (ref 3.5–5.0)
ALK PHOS: 47 U/L (ref 38–126)
ALT: 12 U/L — ABNORMAL LOW (ref 17–63)
ANION GAP: 8 (ref 5–15)
AST: 22 U/L (ref 15–41)
BUN: 17 mg/dL (ref 6–20)
CO2: 23 mmol/L (ref 22–32)
Calcium: 9 mg/dL (ref 8.9–10.3)
Chloride: 104 mmol/L (ref 101–111)
Creatinine, Ser: 0.75 mg/dL (ref 0.61–1.24)
GFR calc Af Amer: >60 mL/min (ref >60)
GFR calc non Af Amer: >60 mL/min (ref >60)
GLUCOSE: 126 mg/dL — AB (ref 65–99)
POTASSIUM: 3.8 mmol/L (ref 3.5–5.1)
SODIUM: 135 mmol/L (ref 135–145)
Total Bilirubin: 0.6 mg/dL (ref 0.3–1.2)
Total Protein: 7.3 g/dL (ref 6.5–8.1)

## 2016-01-20 LAB — CBC
HEMATOCRIT: 45.1 % (ref 39.0–52.0)
HEMOGLOBIN: 14.9 g/dL (ref 13.0–17.0)
MCH: 28.7 pg (ref 26.0–34.0)
MCHC: 33 g/dL (ref 30.0–36.0)
MCV: 86.7 fL (ref 78.0–100.0)
Platelets: 158 10*3/uL (ref 150–400)
RBC: 5.2 MIL/uL (ref 4.22–5.81)
RDW: 12.3 % (ref 11.5–15.5)
WBC: 9.9 10*3/uL (ref 4.0–10.5)

## 2016-01-20 LAB — LIPASE, BLOOD: Lipase: 12 U/L (ref 11–51)

## 2016-01-20 MED ORDER — SODIUM CHLORIDE 0.9 % IV BOLUS (SEPSIS)
1000.0000 mL | Freq: Once | INTRAVENOUS | Status: AC
  Administered 2016-01-21: 1000 mL via INTRAVENOUS

## 2016-01-20 MED ORDER — ONDANSETRON 4 MG PO TBDP
4.0000 mg | ORAL_TABLET | Freq: Once | ORAL | Status: AC | PRN
  Administered 2016-01-20: 4 mg via ORAL
  Filled 2016-01-20: qty 1

## 2016-01-20 MED ORDER — MORPHINE SULFATE (PF) 4 MG/ML IV SOLN
4.0000 mg | Freq: Once | INTRAVENOUS | Status: AC
  Administered 2016-01-21: 4 mg via INTRAVENOUS
  Filled 2016-01-20: qty 1

## 2016-01-20 MED ORDER — ONDANSETRON HCL 4 MG/2ML IJ SOLN
4.0000 mg | Freq: Once | INTRAMUSCULAR | Status: AC
  Administered 2016-01-21: 4 mg via INTRAMUSCULAR
  Filled 2016-01-20: qty 2

## 2016-01-20 NOTE — ED Notes (Signed)
Patient states emesis and body aches started today

## 2016-01-20 NOTE — ED Provider Notes (Signed)
CSN: [id number]     Arrival date & time 01/20/16  2205 History  By signing my name below, I, Ethan Little, attest that this documentation has been prepared under the direction and in the presence of Shon Baton, MD. Electronically Signed: Bethel Little, ED Scribe. 01/20/2016. 11:55 PM     Chief Complaint  Patient presents with  . Emesis     The history is provided by the patient. No language interpreter was used.   Ethan Little is a 25 y.o. male who presents to the Emergency Department complaining of vomiting and myalgias. He reports atraumatic, constant, 9/10 in severity, mid and bilateral back pain with onset today. Associated symptoms include nausea, 7-8 episodes of emesis, and cramping abdominal pain. Pt denies fever, diarrhea, hematuria, dysuria, bowel or bladder difficulty, and new numbness, tingling, or weakness. No history of kidney stones. He is otherwise healthy and takes no daily medication. Denies IV drug use.  Pt is allergic to hydrocodone.   Past Medical History  Diagnosis Date  . Back pain   . Anxiety     history of  . ADHD (attention deficit hyperactivity disorder)     patient has not been to a doctor since high school for this    History reviewed. No pertinent past surgical history. Family History  Problem Relation Age of Onset  . Family history unknown: Yes   Social History  Substance Use Topics  . Smoking status: Current Every Day Smoker -- 1.00 packs/day    Types: Cigarettes  . Smokeless tobacco: None  . Alcohol Use: 0.0 oz/week    0 Standard drinks or equivalent per week     Comment: occassionally    Review of Systems  Constitutional: Negative for fever.  Gastrointestinal: Positive for vomiting and abdominal pain. Negative for diarrhea.  Genitourinary: Negative for dysuria, hematuria and difficulty urinating.  Musculoskeletal: Positive for myalgias and back pain.  Neurological: Negative for weakness and numbness.  All other systems  reviewed and are negative.  Allergies  Hydrocodone  Home Medications   Prior to Admission medications   Medication Sig Start Date End Date Taking? Authorizing Provider  cyclobenzaprine (FLEXERIL) 5 MG tablet Take 1 tablet (5 mg total) by mouth at bedtime. 11/07/15   Edwena Felty, MD  diclofenac (VOLTAREN) 50 MG EC tablet Take 1 tablet (50 mg total) by mouth 2 (two) times daily. 11/12/15   Hope Orlene Och, NP  doxycycline (VIBRAMYCIN) 100 MG capsule Take 1 capsule (100 mg total) by mouth 2 (two) times daily. 11/12/15   Hope Orlene Och, NP  hydrOXYzine (ATARAX/VISTARIL) 10 MG tablet Take 1 tablet (10 mg total) by mouth 3 (three) times daily as needed for itching. 11/07/15   Edwena Felty, MD  ibuprofen (ADVIL,MOTRIN) 600 MG tablet Take 1 tablet (600 mg total) by mouth every 6 (six) hours as needed. 01/21/16   Shon Baton, MD  meloxicam (MOBIC) 15 MG tablet Take 1 tablet (15 mg total) by mouth daily. 11/07/15   Edwena Felty, MD  ondansetron (ZOFRAN ODT) 4 MG disintegrating tablet Take 1 tablet (4 mg total) by mouth every 8 (eight) hours as needed for nausea or vomiting. 01/21/16   Shon Baton, MD   BP 115/65 mmHg  Pulse 104  Temp(Src) 99.3 F (37.4 C) (Oral)  Resp 22  Ht 5\' 4"  (1.626 m)  Wt 120 lb (54.432 kg)  BMI 20.59 kg/m2  SpO2 97% Physical Exam  Constitutional: He is oriented to person, place, and time. He appears  well-developed and well-nourished. No distress.  HENT:  Head: Normocephalic and atraumatic.  Cardiovascular: Normal rate, regular rhythm and normal heart sounds.   No murmur heard. Pulmonary/Chest: Effort normal and breath sounds normal. No respiratory distress. He has no wheezes.  Abdominal: Soft. Bowel sounds are normal. There is no tenderness. There is no rebound and no guarding.  Musculoskeletal: He exhibits no edema.  Tenderness to palpation over the entirety of the lower back including midline and bilateral paraspinous muscle region  Neurological: He is  alert and oriented to person, place, and time.  Skin: Skin is warm and dry.  Psychiatric: He has a normal mood and affect.  Nursing note and vitals reviewed.   ED Course  Procedures (including critical care time) DIAGNOSTIC STUDIES: Oxygen Saturation is 97 % on RA,  normal by my interpretation.    COORDINATION OF CARE: 11:52 PM Discussed treatment plan which includes lab work, CT Renal Stone Study, Zofran, morphine, and IVF with pt at bedside and pt agreed to plan.  Labs Review Labs Reviewed  COMPREHENSIVE METABOLIC PANEL - Abnormal; Notable for the following:    Glucose, Bld 126 (*)    ALT 12 (*)    All other components within normal limits  URINALYSIS, ROUTINE W REFLEX MICROSCOPIC (NOT AT [facility]) - Abnormal; Notable for the following:    Ketones, ur 15 (*)    All other components within normal limits  LIPASE, BLOOD  CBC    Imaging Review Ct Renal Stone Study  01/21/2016  CLINICAL DATA:  Mid back pain, onset date. Nausea. Vomiting. Cramping abdominal pain. EXAM: CT ABDOMEN AND PELVIS WITHOUT CONTRAST TECHNIQUE: Multidetector CT imaging of the abdomen and pelvis was performed following the standard protocol without IV contrast. COMPARISON:  08/05/2011 FINDINGS: The lung bases are clear. Kidneys are symmetrical in size and shape. No hydronephrosis or hydroureter. No renal, ureteral, or bladder stones. No bladder wall thickening. The unenhanced appearance of the liver, spleen, gallbladder, pancreas, adrenal glands, abdominal aorta, inferior vena cava, and retroperitoneal lymph nodes is unremarkable. Stomach, small bowel, and colon are not abnormally distended. Stool fills the colon. No free air or free fluid in the abdomen. Pelvis: Appendix appears normal. No free or loculated pelvic fluid collections. Prostate gland is not enlarged. No pelvic mass or lymphadenopathy. Stool-filled rectosigmoid colon without evidence of diverticulitis. No destructive bone lesions. IMPRESSION: No renal or  ureteral stone or obstruction. Electronically Signed   By: Burman Nieves M.D.   On: 01/21/2016 01:40   I have personally reviewed and evaluated these images and lab results as part of my medical decision-making.   EKG Interpretation None      MDM   Final diagnoses:  Non-intractable vomiting with nausea, vomiting of unspecified type    Patient presents with myalgias, back pain, and vomiting. Nontoxic on exam. No red flags of back pain. History of chronic back pain which is worsened in the setting of acute vomiting. No history of kidney stones. No urinary symptoms. Basic labwork obtained and largely reassuring. Patient reported persistent pain. CT renal stone study obtained and negative. The patient was able to tolerate fluids without difficulty. Reported persistent pain while in the emergency room. He was able to ambulate without difficulty. Discuss with patient supportive care at home including ibuprofen every 6 hours as needed for pain as well as Zofran for nausea.  After history, exam, and medical workup I feel the patient has been appropriately medically screened and is safe for discharge home. Pertinent diagnoses were discussed  with the patient. Patient was given return precautions.  I personally performed the services described in this documentation, which was scribed in my presence. The recorded information has been reviewed and is accurate.    Shon Baton, MD 01/21/16 [phone]

## 2016-01-21 ENCOUNTER — Emergency Department (HOSPITAL_COMMUNITY): Payer: Medicaid Other

## 2016-01-21 LAB — URINALYSIS, ROUTINE W REFLEX MICROSCOPIC
BILIRUBIN URINE: NEGATIVE
Glucose, UA: NEGATIVE mg/dL
HGB URINE DIPSTICK: NEGATIVE
KETONES UR: 15 mg/dL — AB
Leukocytes, UA: NEGATIVE
NITRITE: NEGATIVE
Protein, ur: NEGATIVE mg/dL
Specific Gravity, Urine: 1.015 (ref 1.005–1.030)
pH: 6 (ref 5.0–8.0)

## 2016-01-21 MED ORDER — IBUPROFEN 600 MG PO TABS: 600.0000 mg | ORAL_TABLET | Freq: Four times a day (QID) | ORAL | Status: DC | PRN

## 2016-01-21 MED ORDER — ONDANSETRON 4 MG PO TBDP
4.0000 mg | ORAL_TABLET | Freq: Three times a day (TID) | ORAL | Status: DC | PRN
Start: 2016-01-21 — End: 2016-06-18

## 2016-01-21 NOTE — ED Notes (Signed)
Patient ambulated around nursing station without any difficulties.  O2-96-98 %

## 2016-01-21 NOTE — Discharge Instructions (Signed)
Nausea and Vomiting Nausea is a sick feeling that often comes before throwing up (vomiting). Vomiting is a reflex where stomach contents come out of your mouth. Vomiting can cause severe loss of body fluids (dehydration). Children and elderly adults can become dehydrated quickly, especially if they also have diarrhea. Nausea and vomiting are symptoms of a condition or disease. It is important to find the cause of your symptoms. CAUSES   Direct irritation of the stomach lining. This irritation can result from increased acid production (gastroesophageal reflux disease), infection, food poisoning, taking certain medicines (such as nonsteroidal anti-inflammatory drugs), alcohol use, or tobacco use.  Signals from the brain.These signals could be caused by a headache, heat exposure, an inner ear disturbance, increased pressure in the brain from injury, infection, a tumor, or a concussion, pain, emotional stimulus, or metabolic problems.  An obstruction in the gastrointestinal tract (bowel obstruction).  Illnesses such as diabetes, hepatitis, gallbladder problems, appendicitis, kidney problems, cancer, sepsis, atypical symptoms of a heart attack, or eating disorders.  Medical treatments such as chemotherapy and radiation.  Receiving medicine that makes you sleep (general anesthetic) during surgery. DIAGNOSIS Your caregiver may ask for tests to be done if the problems do not improve after a few days. Tests may also be done if symptoms are severe or if the reason for the nausea and vomiting is not clear. Tests may include:  Urine tests.  Blood tests.  Stool tests.  Cultures (to look for evidence of infection).  X-rays or other imaging studies. Test results can help your caregiver make decisions about treatment or the need for additional tests. TREATMENT You need to stay well hydrated. Drink frequently but in small amounts.You may wish to drink water, sports drinks, clear broth, or eat frozen  ice pops or gelatin dessert to help stay hydrated.When you eat, eating slowly may help prevent nausea.There are also some antinausea medicines that may help prevent nausea. HOME CARE INSTRUCTIONS   Take all medicine as directed by your caregiver.  If you do not have an appetite, do not force yourself to eat. However, you must continue to drink fluids.  If you have an appetite, eat a normal diet unless your caregiver tells you differently.  Eat a variety of complex carbohydrates (rice, wheat, potatoes, bread), lean meats, yogurt, fruits, and vegetables.  Avoid high-fat foods because they are more difficult to digest.  Drink enough water and fluids to keep your urine clear or pale yellow.  If you are dehydrated, ask your caregiver for specific rehydration instructions. Signs of dehydration may include:  Severe thirst.  Dry lips and mouth.  Dizziness.  Dark urine.  Decreasing urine frequency and amount.  Confusion.  Rapid breathing or pulse. SEEK IMMEDIATE MEDICAL CARE IF:   You have blood or brown flecks (like coffee grounds) in your vomit.  You have black or bloody stools.  You have a severe headache or stiff neck.  You are confused.  You have severe abdominal pain.  You have chest pain or trouble breathing.  You do not urinate at least once every 8 hours.  You develop cold or clammy skin.  You continue to vomit for longer than 24 to 48 hours.  You have a fever. MAKE SURE YOU:   Understand these instructions.  Will watch your condition.  Will get help right away if you are not doing well or get worse.   This information is not intended to replace advice given to you by your health care provider. Make sure   you discuss any questions you have with your health care provider.   Document Released: 11/22/2005 Document Revised: 02/14/2012 Document Reviewed: 04/21/2011 Elsevier Interactive Patient Education 2016 Elsevier Inc[employer] Back Pain, Adult Back pain is very  common in adults.The cause of back pain is rarely dangerous and the pain often gets better over time.The cause of your back pain may not be known. Some common causes of back pain include:  Strain of the muscles or ligaments supporting the spine.  Wear and tear (degeneration) of the spinal disks.  Arthritis.  Direct injury to the back. For many people, back pain may return. Since back pain is rarely dangerous, most people can learn to manage this condition on their own. HOME CARE INSTRUCTIONS Watch your back pain for any changes. The following actions may help to lessen any discomfort you are feeling:  Remain active. It is stressful on your back to sit or stand in one place for long periods of time. Do not sit, drive, or stand in one place for more than 30 minutes at a time. Take short walks on even surfaces as soon as you are able.Try to increase the length of time you walk each day.  Exercise regularly as directed by your health care provider. Exercise helps your back heal faster. It also helps avoid future injury by keeping your muscles strong and flexible.  Do not stay in bed.Resting more than 1-2 days can delay your recovery.  Pay attention to your body when you bend and lift. The most comfortable positions are those that put less stress on your recovering back. Always use proper lifting techniques, including:  Bending your knees.  Keeping the load close to your body.  Avoiding twisting.  Find a comfortable position to sleep. Use a firm mattress and lie on your side with your knees slightly bent. If you lie on your back, put a pillow under your knees.  Avoid feeling anxious or stressed.Stress increases muscle tension and can worsen back pain.It is important to recognize when you are anxious or stressed and learn ways to manage it, such as with exercise.  Take medicines only as directed by your health care provider. Over-the-counter medicines to reduce pain and inflammation  are often the most helpful.Your health care provider may prescribe muscle relaxant drugs.These medicines help dull your pain so you can more quickly return to your normal activities and healthy exercise.  Apply ice to the injured area:  Put ice in a plastic bag.  Place a towel between your skin and the bag.  Leave the ice on for 20 minutes, 2-3 times a day for the first 2-3 days. After that, ice and heat may be alternated to reduce pain and spasms.  Maintain a healthy weight. Excess weight puts extra stress on your back and makes it difficult to maintain good posture. SEEK MEDICAL CARE IF:  You have pain that is not relieved with rest or medicine.  You have increasing pain going down into the legs or buttocks.  You have pain that does not improve in one week.  You have night pain.  You lose weight.  You have a fever or chills. SEEK IMMEDIATE MEDICAL CARE IF:   You develop new bowel or bladder control problems.  You have unusual weakness or numbness in your arms or legs.  You develop nausea or vomiting.  You develop abdominal pain.  You feel faint.   This information is not intended to replace advice given to you by your health care provider.   Make sure you discuss any questions you have with your health care provider.   Document Released: 11/22/2005 Document Revised: 12/13/2014 Document Reviewed: 03/26/2014 Elsevier Interactive Patient Education 2016 Elsevier Inc.  

## 2016-02-02 ENCOUNTER — Ambulatory Visit: Payer: Medicaid Other | Admitting: Family Medicine

## 2016-06-18 ENCOUNTER — Encounter (HOSPITAL_COMMUNITY): Payer: Self-pay | Admitting: Cardiology

## 2016-06-18 ENCOUNTER — Emergency Department (HOSPITAL_COMMUNITY)
Admission: EM | Admit: 2016-06-18 | Discharge: 2016-06-18 | Disposition: A | Discharge status: Home / Self Care | Payer: Medicaid Other | Attending: Emergency Medicine | Admitting: Emergency Medicine

## 2016-06-18 DIAGNOSIS — Z79899 Other long term (current) drug therapy: Secondary | ICD-10-CM | POA: Diagnosis not present

## 2016-06-18 DIAGNOSIS — F909 Attention-deficit hyperactivity disorder, unspecified type: Secondary | ICD-10-CM | POA: Diagnosis not present

## 2016-06-18 DIAGNOSIS — Z791 Long term (current) use of non-steroidal anti-inflammatories (NSAID): Secondary | ICD-10-CM | POA: Diagnosis not present

## 2016-06-18 DIAGNOSIS — L03115 Cellulitis of right lower limb: Secondary | ICD-10-CM | POA: Insufficient documentation

## 2016-06-18 DIAGNOSIS — F17210 Nicotine dependence, cigarettes, uncomplicated: Secondary | ICD-10-CM | POA: Diagnosis not present

## 2016-06-18 MED ORDER — IBUPROFEN 600 MG PO TABS: 600.0000 mg | ORAL_TABLET | Freq: Four times a day (QID) | ORAL | Status: DC | PRN

## 2016-06-18 MED ORDER — IBUPROFEN 400 MG PO TABS
400.0000 mg | ORAL_TABLET | Freq: Once | ORAL | Status: AC
  Administered 2016-06-18: 400 mg via ORAL
  Filled 2016-06-18: qty 1

## 2016-06-18 MED ORDER — DOXYCYCLINE HYCLATE 100 MG PO CAPS: 100.0000 mg | ORAL_CAPSULE | Freq: Two times a day (BID) | ORAL | Status: DC

## 2016-06-18 MED ORDER — DOXYCYCLINE HYCLATE 100 MG PO TABS
100.0000 mg | ORAL_TABLET | Freq: Once | ORAL | Status: AC
  Administered 2016-06-18: 100 mg via ORAL
  Filled 2016-06-18: qty 1

## 2016-06-18 MED ORDER — IBUPROFEN 800 MG PO TABS: 800.0000 mg | ORAL_TABLET | Freq: Once | ORAL | Status: DC

## 2016-06-18 NOTE — ED Notes (Signed)
Boil right lower leg times 2 days.

## 2016-06-18 NOTE — Discharge Instructions (Signed)

## 2016-06-18 NOTE — ED Provider Notes (Signed)
CSN: [id number]     Arrival date & time 06/18/16  1450 History   First MD Initiated Contact with Patient 06/18/16 1523     Chief Complaint  Patient presents with  . Abscess     (Consider location/radiation/quality/duration/timing/severity/associated sxs/prior Treatment) The history is provided by the patient.   Ethan Little is a 25 y.o. male presenting with an infection of his right lower leg Which developed 2 days ago.  He endorses a small lesion at his right upper medial calf which was tender, swollen and drained a small amount of pus when he squeezes the spot.  Since then he has had spreading redness and increased tenderness surrounding the site.  There is been no continued drainage from the site.  He denies any specific injury or insect bites, although works in [employer]eron[employer].  He denies fevers or chills, radiation of pain and otherwise feels well.  He has used cold compresses on the site as it swells the more he is weightbearing.      Past Medical History  Diagnosis Date  . Back pain   . Anxiety     history of  . ADHD (attention deficit hyperactivity disorder)     patient has not been to a doctor since high school for this    History reviewed. No pertinent past surgical history. Family History  Problem Relation Age of Onset  . Family history unknown: Yes   Social History  Substance Use Topics  . Smoking status: Current Every Day Smoker -- 1.00 packs/day    Types: Cigarettes  . Smokeless tobacco: None  . Alcohol Use: 0.0 oz/week    0 Standard drinks or equivalent per week     Comment: occassionally    Review of Systems  Constitutional: Negative for fever and chills.  Gastrointestinal: Negative for nausea and vomiting.  Skin: Positive for color change and wound.  Neurological: Negative for numbness.      Allergies  Hydrocodone  Home Medications   Prior to Admission medications   Medication Sig Start Date End Date Taking? Authorizing Provider  doxycycline  (VIBRAMYCIN) 100 MG capsule Take 1 capsule (100 mg total) by mouth 2 (two) times daily. 06/18/16   Burgess AmorJulie Jaley Yan, PA-C  ibuprofen (ADVIL,MOTRIN) 600 MG tablet Take 1 tablet (600 mg total) by mouth every 6 (six) hours as needed. 06/18/16   Burgess AmorJulie Lathyn Griggs, PA-C   BP 120/56 mmHg  Pulse 66  Temp(Src) 97.7 F (36.5 C) (Oral)  Resp 16  Ht 5\' 10"  (1.778 m)  Wt 54.432 kg  BMI 17.22 kg/m2  SpO2 100% Physical Exam  Constitutional: He appears well-developed and well-nourished. No distress.  HENT:  Head: Normocephalic.  Neck: Neck supple.  Cardiovascular: Normal rate.   Pulmonary/Chest: Effort normal. He has no wheezes.  Musculoskeletal: Normal range of motion. He exhibits no edema.  Skin: Lesion noted. There is erythema.  Small papule measuring 0.5 cm right upper medial calf with no fluctuance or drainage.  6 cm surrounding erythema.  No red streaking.    ED Course  Procedures (including critical care time) Labs Review Labs Reviewed - No data to display  Imaging Review No results found. I have personally reviewed and evaluated these images and lab results as part of my medical decision-making.   EKG Interpretation None      MDM   Final diagnoses:  Cellulitis of right lower extremity   Patient placed on doxycycline with first dose given here.  Ibuprofen.  Elevation, warm compresses.  Recheck here if  worsening over the weekend beyond the next 48 hours, otherwise follow up with his PCP for recheck next week.  Patient without any constitutional symptoms at this time, normal vital signs.  He was advised to avoid squeezing the papule.  There is no indication at this time for I & D of the papule  The patient appears reasonably screened and/or stabilized for discharge and I doubt any other medical condition or other [facility] requiring further screening, evaluation, or treatment in the ED at this time prior to discharge.    Burgess Amor, PA-C 06/18/16 1641  Donnetta Hutching, MD 06/19/16 [phone]

## 2016-07-01 ENCOUNTER — Encounter (HOSPITAL_COMMUNITY): Payer: Self-pay | Admitting: Emergency Medicine

## 2016-07-01 ENCOUNTER — Emergency Department (HOSPITAL_COMMUNITY)
Admission: EM | Admit: 2016-07-01 | Discharge: 2016-07-01 | Disposition: A | Discharge status: Home / Self Care | Payer: Medicaid Other | Attending: Emergency Medicine | Admitting: Emergency Medicine

## 2016-07-01 DIAGNOSIS — F1910 Other psychoactive substance abuse, uncomplicated: Secondary | ICD-10-CM

## 2016-07-01 DIAGNOSIS — H538 Other visual disturbances: Secondary | ICD-10-CM | POA: Diagnosis present

## 2016-07-01 DIAGNOSIS — Z791 Long term (current) use of non-steroidal anti-inflammatories (NSAID): Secondary | ICD-10-CM | POA: Diagnosis not present

## 2016-07-01 DIAGNOSIS — Z5181 Encounter for therapeutic drug level monitoring: Secondary | ICD-10-CM | POA: Insufficient documentation

## 2016-07-01 DIAGNOSIS — T672XXA Heat cramp, initial encounter: Secondary | ICD-10-CM | POA: Diagnosis not present

## 2016-07-01 DIAGNOSIS — F17210 Nicotine dependence, cigarettes, uncomplicated: Secondary | ICD-10-CM | POA: Insufficient documentation

## 2016-07-01 LAB — CBC WITH DIFFERENTIAL/PLATELET
BASOS PCT: 0 %
Basophils Absolute: 0 10*3/uL (ref 0.0–0.1)
EOS PCT: 1 %
Eosinophils Absolute: 0.1 10*3/uL (ref 0.0–0.7)
HEMATOCRIT: 44.5 % (ref 39.0–52.0)
Hemoglobin: 14.8 g/dL (ref 13.0–17.0)
Lymphocytes Relative: 31 %
Lymphs Abs: 2.6 10*3/uL (ref 0.7–4.0)
MCH: 29.1 pg (ref 26.0–34.0)
MCHC: 33.3 g/dL (ref 30.0–36.0)
MCV: 87.4 fL (ref 78.0–100.0)
MONO ABS: 0.4 10*3/uL (ref 0.1–1.0)
MONOS PCT: 5 %
NEUTROS ABS: 5.3 10*3/uL (ref 1.7–7.7)
Neutrophils Relative %: 63 %
PLATELETS: 187 10*3/uL (ref 150–400)
RBC: 5.09 MIL/uL (ref 4.22–5.81)
RDW: 12.4 % (ref 11.5–15.5)
WBC: 8.3 10*3/uL (ref 4.0–10.5)

## 2016-07-01 LAB — RAPID URINE DRUG SCREEN, HOSP PERFORMED
Amphetamines: NOT DETECTED
BARBITURATES: NOT DETECTED
Benzodiazepines: POSITIVE — AB
COCAINE: POSITIVE — AB
Opiates: POSITIVE — AB
Tetrahydrocannabinol: NOT DETECTED

## 2016-07-01 LAB — COMPREHENSIVE METABOLIC PANEL
ALBUMIN: 5 g/dL (ref 3.5–5.0)
ALK PHOS: 61 U/L (ref 38–126)
ALT: 16 U/L — AB (ref 17–63)
AST: 27 U/L (ref 15–41)
Anion gap: 7 (ref 5–15)
BILIRUBIN TOTAL: 0.6 mg/dL (ref 0.3–1.2)
BUN: 18 mg/dL (ref 6–20)
CALCIUM: 9.5 mg/dL (ref 8.9–10.3)
CO2: 28 mmol/L (ref 22–32)
CREATININE: 0.93 mg/dL (ref 0.61–1.24)
Chloride: 103 mmol/L (ref 101–111)
GFR calc Af Amer: >60 mL/min (ref >60)
GFR calc non Af Amer: >60 mL/min (ref >60)
GLUCOSE: 91 mg/dL (ref 65–99)
POTASSIUM: 3.7 mmol/L (ref 3.5–5.1)
Sodium: 138 mmol/L (ref 135–145)
TOTAL PROTEIN: 8.3 g/dL — AB (ref 6.5–8.1)

## 2016-07-01 LAB — CK: Total CK: 379 U/L (ref 49–397)

## 2016-07-01 LAB — URINALYSIS, ROUTINE W REFLEX MICROSCOPIC
BILIRUBIN URINE: NEGATIVE
GLUCOSE, UA: NEGATIVE mg/dL
Hgb urine dipstick: NEGATIVE
KETONES UR: NEGATIVE mg/dL
Leukocytes, UA: NEGATIVE
NITRITE: NEGATIVE
PH: 6 (ref 5.0–8.0)
Protein, ur: NEGATIVE mg/dL
Specific Gravity, Urine: >1.03 — ABNORMAL HIGH (ref 1.005–1.030)

## 2016-07-01 LAB — CBG MONITORING, ED: Glucose-Capillary: 127 mg/dL — ABNORMAL HIGH (ref 65–99)

## 2016-07-01 LAB — I-STAT CG4 LACTIC ACID, ED: LACTIC ACID, VENOUS: 1.1 mmol/L (ref 0.5–1.9)

## 2016-07-01 LAB — ETHANOL: Alcohol, Ethyl (B): <5 mg/dL (ref <5)

## 2016-07-01 MED ORDER — SODIUM CHLORIDE 0.9 % IV BOLUS (SEPSIS)
2000.0000 mL | Freq: Once | INTRAVENOUS | Status: AC
  Administered 2016-07-01: 2000 mL via INTRAVENOUS

## 2016-07-01 NOTE — ED Notes (Signed)
Pt ambulated to the bathroom.  

## 2016-07-01 NOTE — ED Triage Notes (Signed)
Pt reports taking antibiotics (doxycycline) 4 days ago for boil on right knee and was standing in the sun working today and started having blurred vision and headache.  Pt denies unilateral weakness, no facial droop, alert and oriented.
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
## 2016-07-01 NOTE — ED Notes (Signed)
Offered patient something to drink for fluid challenge. Patient refused PO fluid.

## 2016-07-01 NOTE — Discharge Instructions (Signed)
Your symptoms are likely related to the heat.  It will help to drink 2 L of water each day. When it is hot and your outside  Your problem also is related to the use of  cocaine; as well as benzodiazepines and narcotics which are not on your prescription list.  The sore area on your leg, will likely improve with symptomatic treatment of a warm compress 3 times a day. Stop taking the doxycycline, which can cause problems, similar to your difficulty today. You did not appear to need an antibiotic, today for the sore.

## 2016-12-27 ENCOUNTER — Emergency Department (HOSPITAL_COMMUNITY): Payer: Medicaid Other

## 2016-12-27 ENCOUNTER — Encounter (HOSPITAL_COMMUNITY): Payer: Self-pay | Admitting: *Deleted

## 2016-12-27 ENCOUNTER — Emergency Department (HOSPITAL_COMMUNITY)
Admission: EM | Admit: 2016-12-27 | Discharge: 2016-12-27 | Discharge status: Against Medical Advice | Payer: Medicaid Other | Attending: Emergency Medicine | Admitting: Emergency Medicine

## 2016-12-27 DIAGNOSIS — F1910 Other psychoactive substance abuse, uncomplicated: Secondary | ICD-10-CM

## 2016-12-27 DIAGNOSIS — T40601A Poisoning by unspecified narcotics, accidental (unintentional), initial encounter: Secondary | ICD-10-CM

## 2016-12-27 DIAGNOSIS — F909 Attention-deficit hyperactivity disorder, unspecified type: Secondary | ICD-10-CM | POA: Diagnosis not present

## 2016-12-27 DIAGNOSIS — F17210 Nicotine dependence, cigarettes, uncomplicated: Secondary | ICD-10-CM | POA: Diagnosis not present

## 2016-12-27 DIAGNOSIS — Z791 Long term (current) use of non-steroidal anti-inflammatories (NSAID): Secondary | ICD-10-CM | POA: Diagnosis not present

## 2016-12-27 LAB — CK: CK TOTAL: 84 U/L (ref 49–397)

## 2016-12-27 LAB — COMPREHENSIVE METABOLIC PANEL
ALBUMIN: 4.4 g/dL (ref 3.5–5.0)
ALK PHOS: 56 U/L (ref 38–126)
ALT: 21 U/L (ref 17–63)
AST: 23 U/L (ref 15–41)
Anion gap: 6 (ref 5–15)
BUN: 11 mg/dL (ref 6–20)
CHLORIDE: 104 mmol/L (ref 101–111)
CO2: 31 mmol/L (ref 22–32)
Calcium: 9.2 mg/dL (ref 8.9–10.3)
Creatinine, Ser: 0.79 mg/dL (ref 0.61–1.24)
GFR calc non Af Amer: >60 mL/min (ref >60)
GLUCOSE: 93 mg/dL (ref 65–99)
POTASSIUM: 4.3 mmol/L (ref 3.5–5.1)
SODIUM: 141 mmol/L (ref 135–145)
Total Bilirubin: 0.1 mg/dL — ABNORMAL LOW (ref 0.3–1.2)
Total Protein: 7.5 g/dL (ref 6.5–8.1)

## 2016-12-27 LAB — CBC WITH DIFFERENTIAL/PLATELET
BASOS PCT: 0 %
Basophils Absolute: 0 10*3/uL (ref 0.0–0.1)
EOS ABS: 0.2 10*3/uL (ref 0.0–0.7)
EOS PCT: 2 %
HCT: 44.1 % (ref 39.0–52.0)
HEMOGLOBIN: 15.1 g/dL (ref 13.0–17.0)
Lymphocytes Relative: 39 %
Lymphs Abs: 4 10*3/uL (ref 0.7–4.0)
MCH: 29.9 pg (ref 26.0–34.0)
MCHC: 34.2 g/dL (ref 30.0–36.0)
MCV: 87.3 fL (ref 78.0–100.0)
MONOS PCT: 7 %
Monocytes Absolute: 0.7 10*3/uL (ref 0.1–1.0)
NEUTROS PCT: 52 %
Neutro Abs: 5.4 10*3/uL (ref 1.7–7.7)
PLATELETS: 197 10*3/uL (ref 150–400)
RBC: 5.05 MIL/uL (ref 4.22–5.81)
RDW: 12 % (ref 11.5–15.5)
WBC: 10.4 10*3/uL (ref 4.0–10.5)

## 2016-12-27 LAB — ETHANOL: Alcohol, Ethyl (B): <5 mg/dL (ref <5)

## 2016-12-27 LAB — ACETAMINOPHEN LEVEL: Acetaminophen (Tylenol), Serum: <10 ug/mL — ABNORMAL LOW (ref 10–30)

## 2016-12-27 LAB — SALICYLATE LEVEL

## 2016-12-27 MED ORDER — NALOXONE HCL 0.4 MG/ML IJ SOLN
0.4000 mg | Freq: Once | INTRAMUSCULAR | Status: AC
  Administered 2016-12-27: 0.4 mg via INTRAVENOUS
  Filled 2016-12-27: qty 1

## 2016-12-27 MED ORDER — SODIUM CHLORIDE 0.9 % IV BOLUS (SEPSIS)
1000.0000 mL | Freq: Once | INTRAVENOUS | Status: AC
  Administered 2016-12-27: 1000 mL via INTRAVENOUS

## 2016-12-27 NOTE — ED Notes (Signed)
0115- Pt states 80's, HR 40's snoring respirations on 2L/[place]. IV started. Narcan given. Patient immediately woken up after Narcan and HR increased to 80's and stats 97-98. A&O times 3. States he snorted Xanax and Heroin and last night, unsure who he was with. States has been using Heroin about 4 months, states was not an attempt at self harm.

## 2016-12-27 NOTE — ED Notes (Signed)
Patient does not recall whom he was it last night but asking where his boots, cigerattes and two hundred dollars that he had is, informed patient that EMS found him with what was brought with him" jeans, a hoodie and blue tooth headset
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
## 2016-12-27 NOTE — ED Notes (Signed)
Patient left AMA, at this time. Ambulatory, walked out without problems
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
## 2016-12-27 NOTE — ED Notes (Signed)
Patient has gotten dressed in room, and attempting to leave. Patient agreed to stay for "a few minutes" for EDP to speak to him. Patient is fully oriented and ambulatory at this time. Patients IV taken out at this time, per his assistance.

## 2016-12-27 NOTE — ED Notes (Signed)
Patient walking around room, using cordless phone at this time.

## 2016-12-27 NOTE — ED Notes (Signed)
Dr. Manus Gunningancour spoke with patient and explained to him the risks of leaving AMA, and patient, is contemplating his choice at this time

## 2017-06-10 ENCOUNTER — Emergency Department (HOSPITAL_COMMUNITY)
Admission: EM | Admit: 2017-06-10 | Discharge: 2017-06-10 | Disposition: A | Discharge status: Home / Self Care | Payer: Medicaid Other | Attending: Emergency Medicine | Admitting: Emergency Medicine

## 2017-06-10 ENCOUNTER — Encounter (HOSPITAL_COMMUNITY): Payer: Self-pay | Admitting: Emergency Medicine

## 2017-06-10 DIAGNOSIS — W57XXXA Bitten or stung by nonvenomous insect and other nonvenomous arthropods, initial encounter: Secondary | ICD-10-CM | POA: Insufficient documentation

## 2017-06-10 DIAGNOSIS — Z885 Allergy status to narcotic agent status: Secondary | ICD-10-CM | POA: Insufficient documentation

## 2017-06-10 DIAGNOSIS — L600 Ingrowing nail: Secondary | ICD-10-CM | POA: Insufficient documentation

## 2017-06-10 DIAGNOSIS — F17210 Nicotine dependence, cigarettes, uncomplicated: Secondary | ICD-10-CM | POA: Insufficient documentation

## 2017-06-10 MED ORDER — TRAMADOL HCL 50 MG PO TABS: ORAL_TABLET | ORAL | 0 refills | Status: DC

## 2017-06-10 MED ORDER — CEPHALEXIN 500 MG PO CAPS
500.0000 mg | ORAL_CAPSULE | Freq: Once | ORAL | Status: AC
  Administered 2017-06-10: 500 mg via ORAL
  Filled 2017-06-10: qty 1

## 2017-06-10 MED ORDER — CEFDINIR 300 MG PO CAPS: 300.0000 mg | ORAL_CAPSULE | Freq: Two times a day (BID) | ORAL | 0 refills | Status: DC

## 2017-06-10 MED ORDER — LIDOCAINE HCL (PF) 1 % IJ SOLN
5.0000 mL | Freq: Once | INTRAMUSCULAR | Status: AC
  Administered 2017-06-10: 5 mL
  Filled 2017-06-10: qty 5

## 2017-06-10 MED ORDER — DOXYCYCLINE HYCLATE 100 MG PO CAPS: 100.0000 mg | ORAL_CAPSULE | Freq: Two times a day (BID) | ORAL | 0 refills | Status: DC

## 2017-06-10 MED ORDER — TRAMADOL HCL 50 MG PO TABS
100.0000 mg | ORAL_TABLET | Freq: Once | ORAL | Status: AC
  Administered 2017-06-10: 100 mg via ORAL
  Filled 2017-06-10: qty 2

## 2017-06-10 MED ORDER — ONDANSETRON HCL 4 MG PO TABS
4.0000 mg | ORAL_TABLET | Freq: Once | ORAL | Status: AC
  Administered 2017-06-10: 4 mg via ORAL
  Filled 2017-06-10: qty 1

## 2017-06-10 MED ORDER — DOXYCYCLINE HYCLATE 100 MG PO TABS
100.0000 mg | ORAL_TABLET | Freq: Once | ORAL | Status: AC
  Administered 2017-06-10: 100 mg via ORAL
  Filled 2017-06-10: qty 1

## 2017-06-10 MED ORDER — POVIDONE-IODINE 10 % EX SOLN
CUTANEOUS | Status: AC
  Administered 2017-06-10: 16:00:00
  Filled 2017-06-10: qty 118

## 2017-06-10 NOTE — ED Triage Notes (Signed)
Patient complaining of insect bite x 2. One is on left arm and one on left earlobe. Also complaining of pain to left big toe. Denies injury.
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
## 2017-06-10 NOTE — Discharge Instructions (Signed)
Cleanse the wound to your year and your arm daily with soap and water. Please apply a Band-Aid to the open area on your left arm. Please soak your foot in warm Epsom salt water for 10-15 minutes daily, dry your foot carefully, and apply fresh dressing daily until the wound has healed from the inside out. Please use the cannabis shoe until you can safely use your own shoes. Elevate your foot when possible. Use doxycycline and Omnicef 2 times daily with a meal. Use Tylenol 500 mg, and ibuprofen 600 mg every 6 hours as needed for mild pain. May use Ultram for more severe pain.See Dr Jamse BelfastSundarum for additional evaluation if not improving.

## 2019-09-21 ENCOUNTER — Other Ambulatory Visit: Payer: Self-pay

## 2019-09-21 ENCOUNTER — Encounter (HOSPITAL_COMMUNITY): Payer: Self-pay | Admitting: Emergency Medicine

## 2019-09-21 ENCOUNTER — Emergency Department (HOSPITAL_COMMUNITY)
Admission: EM | Admit: 2019-09-21 | Discharge: 2019-09-21 | Disposition: A | Discharge status: Home / Self Care | Payer: Self-pay | Attending: Emergency Medicine | Admitting: Emergency Medicine

## 2019-09-21 DIAGNOSIS — L02414 Cutaneous abscess of left upper limb: Secondary | ICD-10-CM | POA: Insufficient documentation

## 2019-09-21 DIAGNOSIS — Z23 Encounter for immunization: Secondary | ICD-10-CM | POA: Insufficient documentation

## 2019-09-21 DIAGNOSIS — F17210 Nicotine dependence, cigarettes, uncomplicated: Secondary | ICD-10-CM | POA: Insufficient documentation

## 2019-09-21 MED ORDER — DOXYCYCLINE HYCLATE 100 MG PO TABS
100.0000 mg | ORAL_TABLET | Freq: Once | ORAL | Status: AC
  Administered 2019-09-21: 11:00:00 100 mg via ORAL
  Filled 2019-09-21: qty 1

## 2019-09-21 MED ORDER — LIDOCAINE-EPINEPHRINE (PF) 2 %-1:200000 IJ SOLN
20.0000 mL | Freq: Once | INTRAMUSCULAR | Status: AC
  Administered 2019-09-21: 20 mL
  Filled 2019-09-21: qty 20

## 2019-09-21 MED ORDER — POVIDONE-IODINE 10 % EX SOLN
CUTANEOUS | Status: AC
  Administered 2019-09-21: 11:00:00
  Filled 2019-09-21: qty 15

## 2019-09-21 MED ORDER — TETANUS-DIPHTH-ACELL PERTUSSIS 5-2.5-18.5 LF-MCG/0.5 IM SUSP
0.5000 mL | Freq: Once | INTRAMUSCULAR | Status: AC
  Administered 2019-09-21: 11:00:00 0.5 mL via INTRAMUSCULAR
  Filled 2019-09-21: qty 0.5

## 2019-09-21 MED ORDER — IBUPROFEN 400 MG PO TABS
400.0000 mg | ORAL_TABLET | Freq: Once | ORAL | Status: AC
  Administered 2019-09-21: 400 mg via ORAL
  Filled 2019-09-21: qty 1

## 2019-09-21 MED ORDER — CEPHALEXIN 500 MG PO CAPS: 500.0000 mg | ORAL_CAPSULE | Freq: Four times a day (QID) | ORAL | 0 refills | Status: AC

## 2019-09-21 MED ORDER — DOXYCYCLINE HYCLATE 100 MG PO CAPS: 100.0000 mg | ORAL_CAPSULE | Freq: Two times a day (BID) | ORAL | 0 refills | Status: AC

## 2019-09-21 MED ORDER — CEPHALEXIN 500 MG PO CAPS
1000.0000 mg | ORAL_CAPSULE | Freq: Once | ORAL | Status: AC
  Administered 2019-09-21: 13:00:00 1000 mg via ORAL
  Filled 2019-09-21: qty 2

## 2019-09-21 NOTE — Discharge Instructions (Addendum)
It was our pleasure to provide your ER care today - we hope that you feel better.  Keep area very clean.   Take antibiotics as prescribed (keflex and doxycycline).  Take ibuprofen or acetaminophen as need.   Follow up with primary care doctor or urgent care in 2 days time for wound check and packing removal - if unable to be seen there, you may return to ER for recheck in 2 days time.  Return to ER right away if worse, new symptoms, fevers, spreading redness, increased swelling, severe pain, numbness, or other concern.
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
## 2019-09-21 NOTE — ED Triage Notes (Addendum)
Pt c/o of an abscess to LT arm x 4 days. Denies fever. Pt noted to have redness and edema around lesion. Hx of meth use. Last time used 1-2 days ago.

## 2019-09-21 NOTE — ED Notes (Signed)
Pt is cussing because he says he arm is swelling EDP in to see pt and pt informed opf process of healing.  Pt still anxious.

## 2020-02-03 ENCOUNTER — Emergency Department (HOSPITAL_COMMUNITY)

## 2020-02-03 ENCOUNTER — Emergency Department (HOSPITAL_COMMUNITY)
Admission: EM | Admit: 2020-02-03 | Discharge: 2020-02-04 | Disposition: E | Attending: Emergency Medicine | Admitting: Emergency Medicine

## 2020-02-03 ENCOUNTER — Encounter (HOSPITAL_COMMUNITY): Payer: Self-pay | Admitting: Emergency Medicine

## 2020-02-03 ENCOUNTER — Other Ambulatory Visit: Payer: Self-pay

## 2020-02-03 DIAGNOSIS — K7200 Acute and subacute hepatic failure without coma: Secondary | ICD-10-CM

## 2020-02-03 DIAGNOSIS — X838XXA Intentional self-harm by other specified means, initial encounter: Secondary | ICD-10-CM | POA: Insufficient documentation

## 2020-02-03 DIAGNOSIS — F17210 Nicotine dependence, cigarettes, uncomplicated: Secondary | ICD-10-CM | POA: Insufficient documentation

## 2020-02-03 DIAGNOSIS — E872 Acidosis, unspecified: Secondary | ICD-10-CM

## 2020-02-03 DIAGNOSIS — T1490XA Injury, unspecified, initial encounter: Secondary | ICD-10-CM

## 2020-02-03 DIAGNOSIS — Z79899 Other long term (current) drug therapy: Secondary | ICD-10-CM | POA: Diagnosis not present

## 2020-02-03 DIAGNOSIS — U071 COVID-19: Secondary | ICD-10-CM | POA: Insufficient documentation

## 2020-02-03 DIAGNOSIS — K7201 Acute and subacute hepatic failure with coma: Secondary | ICD-10-CM | POA: Insufficient documentation

## 2020-02-03 DIAGNOSIS — R404 Transient alteration of awareness: Secondary | ICD-10-CM | POA: Diagnosis present

## 2020-02-03 DIAGNOSIS — T71162A Asphyxiation due to hanging, intentional self-harm, initial encounter: Secondary | ICD-10-CM | POA: Insufficient documentation

## 2020-02-03 DIAGNOSIS — N170 Acute kidney failure with tubular necrosis: Secondary | ICD-10-CM

## 2020-02-03 DIAGNOSIS — N179 Acute kidney failure, unspecified: Secondary | ICD-10-CM | POA: Diagnosis not present

## 2020-02-03 DIAGNOSIS — E875 Hyperkalemia: Secondary | ICD-10-CM | POA: Insufficient documentation

## 2020-02-03 DIAGNOSIS — G9340 Encephalopathy, unspecified: Secondary | ICD-10-CM | POA: Diagnosis not present

## 2020-02-03 DIAGNOSIS — I469 Cardiac arrest, cause unspecified: Secondary | ICD-10-CM

## 2020-02-03 DIAGNOSIS — I6782 Cerebral ischemia: Secondary | ICD-10-CM | POA: Insufficient documentation

## 2020-02-03 DIAGNOSIS — T71164A Asphyxiation due to hanging, undetermined, initial encounter: Secondary | ICD-10-CM

## 2020-02-03 LAB — BLOOD GAS, ARTERIAL
Acid-base deficit: 6.6 mmol/L — ABNORMAL HIGH (ref 0.0–2.0)
Bicarbonate: 16.4 mmol/L — ABNORMAL LOW (ref 20.0–28.0)
FIO2: 100
O2 Saturation: 98.7 %
Patient temperature: 37
pCO2 arterial: 80.2 mmHg (ref 32.0–48.0)
pH, Arterial: 7.072 — CL (ref 7.350–7.450)
pO2, Arterial: 409 mmHg — ABNORMAL HIGH (ref 83.0–108.0)

## 2020-02-03 LAB — SALICYLATE LEVEL: Salicylate Lvl: <7 mg/dL — ABNORMAL LOW (ref 7.0–30.0)

## 2020-02-03 LAB — CBC WITH DIFFERENTIAL/PLATELET
Abs Immature Granulocytes: 0.57 10*3/uL — ABNORMAL HIGH (ref 0.00–0.07)
Basophils Absolute: 0.1 10*3/uL (ref 0.0–0.1)
Basophils Relative: 0 %
Eosinophils Absolute: 0.1 10*3/uL (ref 0.0–0.5)
Eosinophils Relative: 0 %
HCT: 44.9 % (ref 39.0–52.0)
Hemoglobin: 13.7 g/dL (ref 13.0–17.0)
Immature Granulocytes: 3 %
Lymphocytes Relative: 22 %
Lymphs Abs: 5 10*3/uL — ABNORMAL HIGH (ref 0.7–4.0)
MCH: 27 pg (ref 26.0–34.0)
MCHC: 30.5 g/dL (ref 30.0–36.0)
MCV: 88.6 fL (ref 80.0–100.0)
Monocytes Absolute: 1.1 10*3/uL — ABNORMAL HIGH (ref 0.1–1.0)
Monocytes Relative: 5 %
Neutro Abs: 15.9 10*3/uL — ABNORMAL HIGH (ref 1.7–7.7)
Neutrophils Relative %: 70 %
Platelets: 298 10*3/uL (ref 150–400)
RBC: 5.07 MIL/uL (ref 4.22–5.81)
RDW: 12.5 % (ref 11.5–15.5)
WBC: 22.7 10*3/uL — ABNORMAL HIGH (ref 4.0–10.5)
nRBC: 0 % (ref 0.0–0.2)

## 2020-02-03 LAB — COMPREHENSIVE METABOLIC PANEL
ALT: 565 U/L — ABNORMAL HIGH (ref 0–44)
AST: 516 U/L — ABNORMAL HIGH (ref 15–41)
Albumin: 3.7 g/dL (ref 3.5–5.0)
Alkaline Phosphatase: 94 U/L (ref 38–126)
Anion gap: 16 — ABNORMAL HIGH (ref 5–15)
BUN: 21 mg/dL — ABNORMAL HIGH (ref 6–20)
CO2: 19 mmol/L — ABNORMAL LOW (ref 22–32)
Calcium: 8.8 mg/dL — ABNORMAL LOW (ref 8.9–10.3)
Chloride: 102 mmol/L (ref 98–111)
Creatinine, Ser: 1.29 mg/dL — ABNORMAL HIGH (ref 0.61–1.24)
GFR calc Af Amer: >60 mL/min (ref >60)
GFR calc non Af Amer: >60 mL/min (ref >60)
Glucose, Bld: 294 mg/dL — ABNORMAL HIGH (ref 70–99)
Potassium: 6 mmol/L — ABNORMAL HIGH (ref 3.5–5.1)
Sodium: 137 mmol/L (ref 135–145)
Total Bilirubin: 0.4 mg/dL (ref 0.3–1.2)
Total Protein: 7.5 g/dL (ref 6.5–8.1)

## 2020-02-03 LAB — URINALYSIS, ROUTINE W REFLEX MICROSCOPIC
Bilirubin Urine: NEGATIVE
Glucose, UA: 150 mg/dL — AB
Hgb urine dipstick: NEGATIVE
Ketones, ur: NEGATIVE mg/dL
Leukocytes,Ua: NEGATIVE
Nitrite: NEGATIVE
Protein, ur: >=300 mg/dL — AB
Specific Gravity, Urine: 1.019 (ref 1.005–1.030)
pH: 6 (ref 5.0–8.0)

## 2020-02-03 LAB — RAPID URINE DRUG SCREEN, HOSP PERFORMED
Amphetamines: POSITIVE — AB
Barbiturates: NOT DETECTED
Benzodiazepines: POSITIVE — AB
Cocaine: NOT DETECTED
Opiates: NOT DETECTED
Tetrahydrocannabinol: NOT DETECTED

## 2020-02-03 LAB — ACETAMINOPHEN LEVEL: Acetaminophen (Tylenol), Serum: <10 ug/mL — ABNORMAL LOW (ref 10–30)

## 2020-02-03 LAB — RESPIRATORY PANEL BY RT PCR (FLU A&B, COVID)
Influenza A by PCR: NEGATIVE
Influenza B by PCR: NEGATIVE
SARS Coronavirus 2 by RT PCR: POSITIVE — AB

## 2020-02-03 LAB — LACTIC ACID, PLASMA
Lactic Acid, Venous: 7.9 mmol/L (ref 0.5–1.9)
Lactic Acid, Venous: 2.2 mmol/L (ref 0.5–1.9)

## 2020-02-03 MED ORDER — SODIUM CHLORIDE 0.9 % IV SOLN
250.0000 mL | INTRAVENOUS | Status: DC
  Administered 2020-02-03: 250 mL via INTRAVENOUS

## 2020-02-03 MED ORDER — MORPHINE SULFATE (PF) 10 MG/ML IV SOLN
10.0000 mg | Freq: Once | INTRAVENOUS | Status: AC
  Administered 2020-02-03: 10 mg via INTRAVENOUS

## 2020-02-03 MED ORDER — MORPHINE SULFATE (PF) 4 MG/ML IV SOLN
INTRAVENOUS | Status: AC
  Filled 2020-02-03: qty 3

## 2020-02-03 MED ORDER — IOHEXOL 300 MG/ML  SOLN
75.0000 mL | Freq: Once | INTRAMUSCULAR | Status: AC | PRN
  Administered 2020-02-03: 18:00:00 75 mL via INTRAVENOUS

## 2020-02-03 MED ORDER — LORAZEPAM 2 MG/ML IJ SOLN
2.0000 mg | Freq: Once | INTRAMUSCULAR | Status: AC
  Administered 2020-02-03: 19:00:00 2 mg via INTRAVENOUS
  Filled 2020-02-03: qty 1

## 2020-02-03 MED ORDER — NOREPINEPHRINE 4 MG/250ML-% IV SOLN
2.0000 ug/min | INTRAVENOUS | Status: DC
  Administered 2020-02-03: 16:00:00 10 ug/min via INTRAVENOUS

## 2020-02-03 MED ORDER — LORAZEPAM 2 MG/ML IJ SOLN
2.0000 mg | Freq: Once | INTRAMUSCULAR | Status: AC
  Administered 2020-02-03: 20:00:00 2 mg via INTRAVENOUS
  Filled 2020-02-03: qty 1

## 2020-02-04 NOTE — ED Notes (Signed)
Date and time results received: 02-05-20 1730  Test: pH/ PCO2 Critical Value: pH 7.072/ PCO2 80.2  Name of Provider Notified: Eber Hong  Orders Received? Or Actions Taken?:see orders

## 2020-02-04 NOTE — ED Notes (Signed)
Date and time results received: November 15, 20214:57 PM    Test: Lactic Acid Critical Value: 7.9   Name of Provider Notified: Dr. Hyacinth Meeker   Orders Received? Or Actions Taken?: see orders
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
## 2020-02-04 DEATH — deceased

## 2020-02-11 MED FILL — Medication: Qty: 1 | Status: AC
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
